# Patient Record
Sex: Female | Born: 1945 | Race: White | Hispanic: No | Smoking: Former smoker
Health system: Southern US, Community
[De-identification: ages and names within clinical notes are randomized; demographics above are authoritative.]

## PROBLEM LIST (undated history)

## (undated) DIAGNOSIS — R51 Headache: Secondary | ICD-10-CM

## (undated) DIAGNOSIS — E785 Hyperlipidemia, unspecified: Secondary | ICD-10-CM

## (undated) DIAGNOSIS — M1990 Unspecified osteoarthritis, unspecified site: Secondary | ICD-10-CM

## (undated) DIAGNOSIS — Z950 Presence of cardiac pacemaker: Secondary | ICD-10-CM

## (undated) DIAGNOSIS — F419 Anxiety disorder, unspecified: Secondary | ICD-10-CM

## (undated) DIAGNOSIS — Z87898 Personal history of other specified conditions: Secondary | ICD-10-CM

## (undated) DIAGNOSIS — R519 Headache, unspecified: Secondary | ICD-10-CM

## (undated) DIAGNOSIS — I442 Atrioventricular block, complete: Secondary | ICD-10-CM

## (undated) DIAGNOSIS — I2510 Atherosclerotic heart disease of native coronary artery without angina pectoris: Secondary | ICD-10-CM

## (undated) DIAGNOSIS — R112 Nausea with vomiting, unspecified: Secondary | ICD-10-CM

## (undated) DIAGNOSIS — I452 Bifascicular block: Secondary | ICD-10-CM

## (undated) DIAGNOSIS — I350 Nonrheumatic aortic (valve) stenosis: Secondary | ICD-10-CM

## (undated) DIAGNOSIS — Z9289 Personal history of other medical treatment: Secondary | ICD-10-CM

## (undated) DIAGNOSIS — Z9889 Other specified postprocedural states: Secondary | ICD-10-CM

## (undated) DIAGNOSIS — G43909 Migraine, unspecified, not intractable, without status migrainosus: Secondary | ICD-10-CM

## (undated) DIAGNOSIS — G8929 Other chronic pain: Secondary | ICD-10-CM

## (undated) DIAGNOSIS — I714 Abdominal aortic aneurysm, without rupture, unspecified: Secondary | ICD-10-CM

## (undated) DIAGNOSIS — I6529 Occlusion and stenosis of unspecified carotid artery: Secondary | ICD-10-CM

## (undated) DIAGNOSIS — K219 Gastro-esophageal reflux disease without esophagitis: Secondary | ICD-10-CM

## (undated) DIAGNOSIS — C801 Malignant (primary) neoplasm, unspecified: Secondary | ICD-10-CM

## (undated) DIAGNOSIS — R011 Cardiac murmur, unspecified: Secondary | ICD-10-CM

## (undated) HISTORY — PX: TONSILLECTOMY: SUR1361

## (undated) HISTORY — DX: Abdominal aortic aneurysm, without rupture: I71.4

## (undated) HISTORY — PX: ABDOMINAL HYSTERECTOMY: SHX81

## (undated) HISTORY — DX: Migraine, unspecified, not intractable, without status migrainosus: G43.909

## (undated) HISTORY — DX: Personal history of other medical treatment: Z92.89

## (undated) HISTORY — PX: APPENDECTOMY: SHX54

## (undated) HISTORY — DX: Atherosclerotic heart disease of native coronary artery without angina pectoris: I25.10

## (undated) HISTORY — DX: Occlusion and stenosis of unspecified carotid artery: I65.29

## (undated) HISTORY — DX: Atrioventricular block, complete: I44.2

## (undated) HISTORY — DX: Other chronic pain: G89.29

## (undated) HISTORY — DX: Hyperlipidemia, unspecified: E78.5

## (undated) HISTORY — DX: Abdominal aortic aneurysm, without rupture, unspecified: I71.40

---

## 1976-07-05 DIAGNOSIS — C801 Malignant (primary) neoplasm, unspecified: Secondary | ICD-10-CM

## 1976-07-05 HISTORY — DX: Malignant (primary) neoplasm, unspecified: C80.1

## 1999-07-29 ENCOUNTER — Encounter: Payer: Self-pay | Admitting: Family Medicine

## 1999-07-29 ENCOUNTER — Encounter: Admission: RE | Admit: 1999-07-29 | Discharge: 1999-07-29 | Payer: Self-pay | Admitting: Family Medicine

## 2000-09-16 ENCOUNTER — Encounter: Admission: RE | Admit: 2000-09-16 | Discharge: 2000-09-16 | Payer: Self-pay | Admitting: Family Medicine

## 2000-09-16 ENCOUNTER — Encounter: Payer: Self-pay | Admitting: Family Medicine

## 2001-03-10 ENCOUNTER — Encounter: Payer: Self-pay | Admitting: Family Medicine

## 2001-03-10 ENCOUNTER — Encounter: Admission: RE | Admit: 2001-03-10 | Discharge: 2001-03-10 | Payer: Self-pay | Admitting: Family Medicine

## 2001-11-07 ENCOUNTER — Encounter: Payer: Self-pay | Admitting: Family Medicine

## 2001-11-07 ENCOUNTER — Encounter: Admission: RE | Admit: 2001-11-07 | Discharge: 2001-11-07 | Payer: Self-pay | Admitting: Family Medicine

## 2003-01-11 ENCOUNTER — Encounter: Admission: RE | Admit: 2003-01-11 | Discharge: 2003-01-11 | Payer: Self-pay | Admitting: Family Medicine

## 2003-01-11 ENCOUNTER — Encounter: Payer: Self-pay | Admitting: Family Medicine

## 2003-05-13 ENCOUNTER — Encounter (INDEPENDENT_AMBULATORY_CARE_PROVIDER_SITE_OTHER): Payer: Self-pay | Admitting: Specialist

## 2003-05-13 ENCOUNTER — Ambulatory Visit (HOSPITAL_COMMUNITY): Admission: RE | Admit: 2003-05-13 | Discharge: 2003-05-13 | Payer: Self-pay | Admitting: Gastroenterology

## 2004-01-14 ENCOUNTER — Ambulatory Visit (HOSPITAL_COMMUNITY): Admission: RE | Admit: 2004-01-14 | Discharge: 2004-01-14 | Payer: Self-pay | Admitting: Family Medicine

## 2004-01-21 ENCOUNTER — Encounter: Admission: RE | Admit: 2004-01-21 | Discharge: 2004-01-21 | Payer: Self-pay | Admitting: Family Medicine

## 2005-02-04 ENCOUNTER — Encounter: Admission: RE | Admit: 2005-02-04 | Discharge: 2005-02-04 | Payer: Self-pay | Admitting: Family Medicine

## 2006-02-17 ENCOUNTER — Encounter: Admission: RE | Admit: 2006-02-17 | Discharge: 2006-02-17 | Payer: Self-pay | Admitting: Family Medicine

## 2006-07-05 HISTORY — PX: BREAST EXCISIONAL BIOPSY: SUR124

## 2007-03-15 ENCOUNTER — Encounter: Admission: RE | Admit: 2007-03-15 | Discharge: 2007-03-15 | Payer: Self-pay | Admitting: Family Medicine

## 2007-07-06 HISTORY — PX: BACK SURGERY: SHX140

## 2008-02-21 ENCOUNTER — Observation Stay (HOSPITAL_COMMUNITY): Admission: EM | Admit: 2008-02-21 | Discharge: 2008-02-22 | Payer: Self-pay | Admitting: Emergency Medicine

## 2008-02-22 HISTORY — PX: CARDIAC CATHETERIZATION: SHX172

## 2008-03-28 ENCOUNTER — Encounter: Admission: RE | Admit: 2008-03-28 | Discharge: 2008-03-28 | Payer: Self-pay | Admitting: Family Medicine

## 2009-04-10 ENCOUNTER — Encounter: Admission: RE | Admit: 2009-04-10 | Discharge: 2009-04-10 | Payer: Self-pay | Admitting: Family Medicine

## 2009-10-21 ENCOUNTER — Emergency Department (HOSPITAL_COMMUNITY): Admission: EM | Admit: 2009-10-21 | Discharge: 2009-10-21 | Payer: Self-pay | Admitting: Emergency Medicine

## 2009-11-03 HISTORY — PX: NM MYOCAR PERF WALL MOTION: HXRAD629

## 2010-01-21 ENCOUNTER — Ambulatory Visit (HOSPITAL_BASED_OUTPATIENT_CLINIC_OR_DEPARTMENT_OTHER): Admission: RE | Admit: 2010-01-21 | Discharge: 2010-01-21 | Payer: Self-pay | Admitting: Orthopedic Surgery

## 2010-04-14 ENCOUNTER — Encounter: Admission: RE | Admit: 2010-04-14 | Discharge: 2010-04-14 | Payer: Self-pay | Admitting: Family Medicine

## 2010-07-26 ENCOUNTER — Encounter: Payer: Self-pay | Admitting: Family Medicine

## 2010-09-19 LAB — POCT I-STAT 4, (NA,K, GLUC, HGB,HCT)
Glucose, Bld: 93 mg/dL (ref 70–99)
Potassium: 4.3 mEq/L (ref 3.5–5.1)

## 2010-09-22 LAB — POCT CARDIAC MARKERS: Troponin i, poc: <0.05 ng/mL (ref 0.00–0.09)

## 2010-09-22 LAB — CBC
Hemoglobin: 14.4 g/dL (ref 12.0–15.0)
MCHC: 35.5 g/dL (ref 30.0–36.0)
MCV: 91.9 fL (ref 78.0–100.0)
RDW: 14.2 % (ref 11.5–15.5)

## 2010-09-22 LAB — BASIC METABOLIC PANEL
BUN: 13 mg/dL (ref 6–23)
GFR calc Af Amer: >60 mL/min (ref >60)
Glucose, Bld: 94 mg/dL (ref 70–99)
Potassium: 3.9 mEq/L (ref 3.5–5.1)
Sodium: 139 mEq/L (ref 135–145)

## 2010-09-22 LAB — CK TOTAL AND CKMB (NOT AT ARMC)
CK, MB: 1.3 ng/mL (ref 0.3–4.0)
Relative Index: INVALID (ref 0.0–2.5)
Total CK: 65 U/L (ref 7–177)

## 2010-09-22 LAB — DIFFERENTIAL
Basophils Absolute: 0.1 10*3/uL (ref 0.0–0.1)
Eosinophils Relative: 2 % (ref 0–5)
Lymphs Abs: 2.7 10*3/uL (ref 0.7–4.0)
Monocytes Absolute: 0.7 10*3/uL (ref 0.1–1.0)
Monocytes Relative: 9 % (ref 3–12)
Neutrophils Relative %: 53 % (ref 43–77)

## 2010-09-22 LAB — TROPONIN I: Troponin I: 0.01 ng/mL (ref 0.00–0.06)

## 2010-11-12 HISTORY — PX: TRANSTHORACIC ECHOCARDIOGRAM: SHX275

## 2010-11-17 NOTE — Discharge Summary (Signed)
[name], [name] NO.:  [id number]   MEDICAL RECORD NO.:  [id number]          PATIENT TYPE:  INP   LOCATION:  3735                         FACILITY:  [facility]   PHYSICIAN:  Nicki Guadalajara, M.D.     DATE OF BIRTH:  07-Nov-1945   DATE OF ADMISSION:  02/21/2008  DATE OF DISCHARGE:  02/22/2008                               DISCHARGE SUMMARY   DISCHARGE DIAGNOSES:  1. Chest pain worrisome for unstable angina, catheterization this      admission revealing mild-to-moderate coronary disease.  2. History of smoking.  3. History of dyslipidemia.  4. Family history of coronary artery disease.  5. Chronic back pain, status post surgery twice in the past.   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  She had  surgery in February 2009.  She is out until April 2010.  She has risk  factors for coronary artery disease.  She presented to emergency room on  February 21, 2008, with chest pain worrisome for unstable angina.  Please  see admission history and physical for complete details.  She has  multiple risk factors for coronary disease.  She was started on IV  heparin and nitrates.  Enzymes were negative.  She was set up for  diagnostic catheterization, which was done today on February 22, 2008.  Catheterization by Dr. 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  EF was 60%.  There was mild renal  artery narrowing.  Plan is for continued medical therapy.   DISCHARGE MEDICATIONS:  The patient has recently been put on simvastatin  80 mg a day by her primary care doctor and she will continue this.  She  will also start baby aspirin a day.  She can continue taking her anti-  inflammatory medicine for her back, although we will add omeprazole 20  mg a day with this.  She is also on metoprolol 25 mg twice a day.  She  can continue taking her vitamins  including vitamin E, fish oil, and  Premarin 1.25 mg a day.   LABS:  White count 9.1, hemoglobin 13.8, hematocrit 40.5, and platelets  176.  INR 1.0.  Sodium 140, potassium 3.8, BUN 12, creatinine 0.64.  Lipids done show a VLDL of 41, LDL 90, triglycerides 207 on a nonfasting  sample, HDL 50.  CK-MB and troponins were negative.  TSH normal 0.59.   Chest x-ray, no active disease.   DISPOSITION:  The patient is discharged in stable condition and will  follow up with Dr. Tresa Endo in a couple of weeks.      Abelino Derrick, P.A.    ______________________________  Nicki Guadalajara, M.D.    Lenard Lance  D:  02/22/2008  T:  02/23/2008  Job:  [id number]   cc:   Nicki Guadalajara, M.D.

## 2010-11-17 NOTE — Cardiovascular Report (Signed)
[name], [name]              ACCOUNT NO.:  [id number]   MEDICAL RECORD NO.:  [id number]          PATIENT TYPE:  INP   LOCATION:  3735                         FACILITY:  [facility]   PHYSICIAN:  Richard A. Alanda Amass, M.D.DATE OF BIRTH:  [birth year]   DATE OF PROCEDURE:  02/22/2008  DATE OF DISCHARGE:  02/22/2008                            CARDIAC CATHETERIZATION   PROCEDURES:  Retrograde central aortic catheterization, selective  coronary angiography via Judkins technique, pre- and post-intracoronary  nitroglycerin administration, left ventricular angiogram in right  anterior oblique and left anterior oblique projection, abdominal aortic  angiogram midstream posteroanterior projection.   PROCEDURE IN DETAIL:  The patient was brought to the second floor of CP  Lab in a postabsorptive state.  Informed consent was obtained to proceed  with diagnostic procedure.  Preoperative laboratory showed normal renal  function and coags.  The patient was premedicated with 5 mg Valium p.o.  who was in a postabsorptive state, brought to the second floor of CP  Lab.  Right groin was prepped and draped in usual manner.  Xylocaine 1%  was used for local anesthesia.  The CRFA was entered with single  anterior puncture using 18 thin wall needle and a 6-French short sidearm  sheath were inserted without difficulty.  The patient was given 2 mg of  Versed for sedation IV.  Select guidewire exchange was used throughout  the procedure and selective coronary angiography was done with 6-French  4-cm taper Cordis preformed coronary and pigtail catheters.  The IC  nitroglycerin 200 mg was given at the right coronary artery with repeat  injections obtained in the RAO and LAO projection.  LV angiogram was  done at the RAO and LAO projection 25 mL and 14 mL per second, and  20  mL and 12 mL per second respectively.  Pullback pressure to CA was  performed showed no gradient across the aortic valve.  Catheter was  brought down above the level of the renal arteries and abdominal aortic  angiogram was done in the midstream PA projection demonstrating single  patent normal renal arteries, patent SMA and celiac axis, patent IMA and  no significant proximal iliac disease.  There was mild infrarenal  atherosclerotic disease and normal single renal arteries bilaterally.  There was no evidence of FMD.   Catheters were removed.  Sidearm sheath was flushed and the hand  injection at the right SFA and common femoral showed good puncture into  the RCFA.  Using standard technique and a 6-French StarClose nitinol  clip was successfully deployed for vessel closure.  The patient was  transferred to the holding area for postoperative care in stable  condition.  She tolerated the procedure well.   PRESSURES:  LV:  125/0; LVEDP 16 mmHg.   CA:  25/60 mmHg.  There is no gradient between LV and CA catheter  pullback.   LV angiogram demonstrated normally contracting ventricle in both  projections with no segmental wall motion abnormality and no mitral  regurgitation.  Systolic function was vigorous and EF was 60% or  slightly greater.   Fluoroscopy showed very faint calcification in  the proximal LAD reveals  no other coronary intracardiac or valvular calcification visualized.   The main left coronary artery was short, but widely patent and normal.   The LAD coursed at the apex of the heart where it bifurcated.  It was  relatively thin from the midportion to the apex, but fairly good  residual diameter and mild irregularities and no significant areas of  stenosis.  There is a small area in the mid LAD by myocardial bridging,  but no systolic compression or stenosis.  Flow throughout the LAD and  diagonals was normal.  The large first diagonal occurred before the  first septal perforator trifurcated and was essentially normal.   There was a small normal OD branch.  There was a small normal OM1  branch.  There was  mild 20% irregularity at the proximal circumflex just  before the PAVG branch, which was normal.  The remainder of the AV  groove circumflex was widely patent, smooth with no narrowing.  There  was a moderate size OM2, was smaller and free, and the distal circumflex  was normal.   The right coronary was a dominant vessel.  There was mildly segmental  50% or slightly less concentric narrowing of the mid RCA with a  minimally irregular appearance.  There was no thrombus or disruption  present and there was excellent residual lumen.  There was no  significant change with nitroglycerin.  Beyond this, was a normal RV  branch.  There was a normal PDA.  Second normal RV branch beyond the  acute margin and the normal PDA and bifurcating PLA, which were both  widely patent and large.   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  She has a history of smoking, recent diagnosis of hyperlipidemia.  She  has had back surgery with back fusion last year at [facility] and is doing  better in this regard.  She was admitted on February 21, 2008, with  recurrent chest pain that was nitrate responsive and compatible with  ischemia.  She was seen by Dr. Tresa Endo and set up for diagnostic  catheterization and was started on medical therapy.  Catheterization  reveals atherosclerotic approximately 50% narrowing mildly segmental of  the proximal - mid dominant right coronary artery.  No significant left  coronary artery disease, normal LV function, and no renal artery  stenosis.  She does have history of hyperlipidemia and normal blood  pressure and no history of diabetes.  We will recommend a vigorous trial  of medical therapy, smoking cessation, and addition of antiplatelet  therapy.  Probably I doubt, she might benefit from empiric PPI therapy  as well at this time.  We would recommend follow up with her primary  care  physician as well as Dr. Tresa Endo for her mild coronary artery  disease.   CATHETERIZATION DIAGNOSES:  1. Chest pain, nitrate responsive compatible with ischemia.  2. Mild single vessel coronary artery disease, 50% proximal - mid      right coronary artery.  3. Chronic smoker.  4. Hyperlipidemia.  5. Normal left ventricular function.  6. Lumbosacral back fusion with rods and apparatus visible on      fluoroscopy.      Richard A. Alanda Amass, M.D.  Electronically Signed     RAW/MEDQ  D:  02/22/2008  T:  02/23/2008  Job:  [id number]   cc:   [facility]  Ace Gins, MD  Nicki Guadalajara, M.D.

## 2010-11-20 NOTE — Op Note (Signed)
   NAME:  [name], [name]                        ACCOUNT NO.:  [id number]   MEDICAL RECORD NO.:  [id number]                   PATIENT TYPE:  AMB   LOCATION:  ENDO                                 FACILITY:  [facility]   PHYSICIAN:  Anselmo Rod, M.D.               DATE OF BIRTH:  [birth year]   DATE OF PROCEDURE:  05/13/2003  DATE OF DISCHARGE:                                 OPERATIVE REPORT   PROCEDURE:  Colonoscopy with biopsies x3.   ENDOSCOPIST:  Anselmo Rod, M.D.   INSTRUMENT USED:  Olympus video colonoscope.   INDICATIONS FOR PROCEDURE:  Screening colonoscopy is performed in a 65-year-  old female to rule out colonic polyps, masses, etc.   PREPROCEDURE PREPARATION:  Informed consent was procured from the patient.  The patient was fasted for eight hours prior to the procedure and prepped  with a bottle of magnesium citrate and a gallon of GoLYTELY the night prior  to the procedure.   PREPROCEDURE PHYSICAL EXAMINATION:  VITAL SIGNS:  Stable.  NECK:  Supple.  CHEST:  Clear to auscultation.  S1 and S2 regular.  ABDOMEN:  Soft with normal bowel sounds.   DESCRIPTION OF PROCEDURE:  The patient was placed in the left lateral  decubitus position and sedated with 60 mg of Demerol and 6 mg of Versed  intravenously.  She was also given 400 mg of Cipro intravenously for SBE  prophylaxis.  Once the patient was adequately sedated and maintained on low  flow oxygen and continuous cardiac monitoring, the Olympus video colonoscope  was advanced from the rectum to the cecum without difficulty.  The  appendiceal orifice and ileocecal valve were clearly visualized and  photographed.  There were two small ulcerations noted in the terminal ileum  probably secondary to nonsteroidal use.  Biopsies were done.  No other  abnormalities were noted.  The patient tolerated the procedure well without  complications.   IMPRESSION:  1. Normal colonoscopy up to the cecum.  2. Two small ulcers seen in  the terminal ileum.  Biopsies were done.    RECOMMENDATIONS:  Avoid nonsteroidals.  Await pathology results.  Outpatient  follow-up in the next two weeks or earlier if needed.  Repeat CRC screening  in the next 10 years unless the patient develops any abnormal symptoms in  the interim.                                               Anselmo Rod, M.D.    JNM/MEDQ  D:  05/13/2003  T:  05/14/2003  Job:  [id number]   cc:   Tammy R. Collins Scotland, M.D.  [address]  [place]  [place] [address]  Fax: [phone]

## 2011-02-24 ENCOUNTER — Other Ambulatory Visit: Payer: Self-pay | Admitting: Dermatology

## 2011-03-09 ENCOUNTER — Other Ambulatory Visit: Payer: Self-pay | Admitting: Family Medicine

## 2011-03-09 DIAGNOSIS — Z1231 Encounter for screening mammogram for malignant neoplasm of breast: Secondary | ICD-10-CM

## 2011-04-16 ENCOUNTER — Ambulatory Visit
Admission: RE | Admit: 2011-04-16 | Discharge: 2011-04-16 | Disposition: A | Discharge status: Home / Self Care | Payer: Medicare Other | Source: Ambulatory Visit | Attending: Family Medicine | Admitting: Family Medicine

## 2011-04-16 DIAGNOSIS — Z1231 Encounter for screening mammogram for malignant neoplasm of breast: Secondary | ICD-10-CM

## 2011-04-21 ENCOUNTER — Other Ambulatory Visit: Payer: Self-pay | Admitting: Family Medicine

## 2011-04-21 DIAGNOSIS — R928 Other abnormal and inconclusive findings on diagnostic imaging of breast: Secondary | ICD-10-CM

## 2011-05-06 ENCOUNTER — Ambulatory Visit
Admission: RE | Admit: 2011-05-06 | Discharge: 2011-05-06 | Disposition: A | Discharge status: Home / Self Care | Payer: Medicare Other | Source: Ambulatory Visit | Attending: Family Medicine | Admitting: Family Medicine

## 2011-05-06 DIAGNOSIS — R928 Other abnormal and inconclusive findings on diagnostic imaging of breast: Secondary | ICD-10-CM

## 2011-12-08 ENCOUNTER — Other Ambulatory Visit: Payer: Self-pay | Admitting: Family Medicine

## 2011-12-08 DIAGNOSIS — N649 Disorder of breast, unspecified: Secondary | ICD-10-CM

## 2011-12-15 ENCOUNTER — Other Ambulatory Visit: Payer: Self-pay | Admitting: Family Medicine

## 2011-12-15 ENCOUNTER — Ambulatory Visit
Admission: RE | Admit: 2011-12-15 | Discharge: 2011-12-15 | Disposition: A | Discharge status: Home / Self Care | Payer: Medicare Other | Source: Ambulatory Visit | Attending: Family Medicine | Admitting: Family Medicine

## 2011-12-15 DIAGNOSIS — N649 Disorder of breast, unspecified: Secondary | ICD-10-CM

## 2012-03-07 ENCOUNTER — Other Ambulatory Visit: Payer: Self-pay | Admitting: Family Medicine

## 2012-03-07 DIAGNOSIS — Z1231 Encounter for screening mammogram for malignant neoplasm of breast: Secondary | ICD-10-CM

## 2012-04-17 ENCOUNTER — Ambulatory Visit
Admission: RE | Admit: 2012-04-17 | Discharge: 2012-04-17 | Disposition: A | Discharge status: Home / Self Care | Payer: Medicare Other | Source: Ambulatory Visit | Attending: Family Medicine | Admitting: Family Medicine

## 2012-04-17 DIAGNOSIS — Z1231 Encounter for screening mammogram for malignant neoplasm of breast: Secondary | ICD-10-CM

## 2012-12-28 ENCOUNTER — Other Ambulatory Visit: Payer: Self-pay

## 2012-12-28 MED ORDER — NEBIVOLOL HCL 5 MG PO TABS: 5.0000 mg | ORAL_TABLET | Freq: Every day | ORAL | Status: DC

## 2012-12-28 NOTE — Telephone Encounter (Signed)
Rx was sent to pharmacy electronically. 

## 2013-02-08 ENCOUNTER — Other Ambulatory Visit: Payer: Self-pay | Admitting: *Deleted

## 2013-02-08 MED ORDER — NEBIVOLOL HCL 5 MG PO TABS: 5.0000 mg | ORAL_TABLET | Freq: Every day | ORAL | Status: DC

## 2013-02-08 NOTE — Telephone Encounter (Signed)
Rx was sent to pharmacy electronically. 

## 2013-02-12 ENCOUNTER — Other Ambulatory Visit: Payer: Self-pay | Admitting: *Deleted

## 2013-03-13 ENCOUNTER — Other Ambulatory Visit: Payer: Self-pay

## 2013-03-13 DIAGNOSIS — Z1231 Encounter for screening mammogram for malignant neoplasm of breast: Secondary | ICD-10-CM

## 2013-04-19 ENCOUNTER — Ambulatory Visit
Admission: RE | Admit: 2013-04-19 | Discharge: 2013-04-19 | Disposition: A | Discharge status: Home / Self Care | Payer: Medicare Other | Source: Ambulatory Visit

## 2013-04-19 DIAGNOSIS — Z1231 Encounter for screening mammogram for malignant neoplasm of breast: Secondary | ICD-10-CM

## 2013-05-12 ENCOUNTER — Other Ambulatory Visit: Payer: Self-pay | Admitting: Cardiovascular Disease

## 2013-05-14 NOTE — Telephone Encounter (Signed)
Rx was sent to pharmacy electronically. 

## 2013-06-19 ENCOUNTER — Other Ambulatory Visit: Payer: Self-pay | Admitting: Cardiovascular Disease

## 2013-06-19 NOTE — Telephone Encounter (Signed)
Rx was sent to pharmacy electronically. 

## 2013-07-04 ENCOUNTER — Other Ambulatory Visit: Payer: Self-pay | Admitting: Cardiovascular Disease

## 2013-07-28 ENCOUNTER — Other Ambulatory Visit: Payer: Self-pay | Admitting: Cardiovascular Disease

## 2013-07-30 ENCOUNTER — Telehealth: Payer: Self-pay | Admitting: *Deleted

## 2013-07-30 ENCOUNTER — Other Ambulatory Visit: Payer: Self-pay | Admitting: *Deleted

## 2013-07-30 MED ORDER — NEBIVOLOL HCL 5 MG PO TABS
5.0000 mg | ORAL_TABLET | Freq: Every day | ORAL | Status: DC
Start: 2013-07-30 — End: 2014-01-11

## 2013-07-30 NOTE — Telephone Encounter (Signed)
Correction made on Bystolic refill.  She is in recall to see Dr. Claiborne Billings in May 2015.  Refills given until then.

## 2013-07-30 NOTE — Telephone Encounter (Signed)
Pt is stating that the pharmacy is stating that we need to call them because they will not fill her medication. She was told that she needed to have an appointment. She needs a refill on Bystolic 5mg .   TK

## 2013-07-30 NOTE — Telephone Encounter (Signed)
Returned call.  Pt stated another lady called a few minutes ago.

## 2013-11-12 ENCOUNTER — Other Ambulatory Visit: Payer: Self-pay

## 2013-11-16 ENCOUNTER — Encounter: Payer: Self-pay | Admitting: *Deleted

## 2013-11-19 ENCOUNTER — Ambulatory Visit (INDEPENDENT_AMBULATORY_CARE_PROVIDER_SITE_OTHER): Payer: Medicare Other | Admitting: Cardiovascular Disease

## 2013-11-19 ENCOUNTER — Encounter: Payer: Self-pay | Admitting: Cardiovascular Disease

## 2013-11-19 VITALS — BP 142/72 | HR 61 | Ht 65.0 in | Wt 167.0 lb

## 2013-11-19 DIAGNOSIS — I2510 Atherosclerotic heart disease of native coronary artery without angina pectoris: Secondary | ICD-10-CM | POA: Insufficient documentation

## 2013-11-19 DIAGNOSIS — R5383 Other fatigue: Secondary | ICD-10-CM

## 2013-11-19 DIAGNOSIS — E782 Mixed hyperlipidemia: Secondary | ICD-10-CM

## 2013-11-19 DIAGNOSIS — R002 Palpitations: Secondary | ICD-10-CM

## 2013-11-19 DIAGNOSIS — R5381 Other malaise: Secondary | ICD-10-CM

## 2013-11-19 DIAGNOSIS — R0989 Other specified symptoms and signs involving the circulatory and respiratory systems: Secondary | ICD-10-CM

## 2013-11-19 DIAGNOSIS — I4510 Unspecified right bundle-branch block: Secondary | ICD-10-CM

## 2013-11-19 DIAGNOSIS — I440 Atrioventricular block, first degree: Secondary | ICD-10-CM | POA: Insufficient documentation

## 2013-11-19 DIAGNOSIS — I350 Nonrheumatic aortic (valve) stenosis: Secondary | ICD-10-CM

## 2013-11-19 DIAGNOSIS — I359 Nonrheumatic aortic valve disorder, unspecified: Secondary | ICD-10-CM

## 2013-11-19 NOTE — Progress Notes (Signed)
Patient ID: [name], female   DOB: [birth year], 68 y.o.   MRN: [id number]     HPI: FREDDA Rhodes is a 68 y.o. female who presents to the office today for a 21 month follow up cardiology evaluation.  Ms [name] has documented mild coronary obstructive disease by cardiac catheterization in 2009.  When she was found to have 50% RCA stenosis and 20% stenosis in the proximal left circumflex coronary artery.  In the past, she has a history of atrial arrhythmia, treated initially with metoprolol, but due to significant fatigability.  This was switched to Bystolic, for which he has tolerated well.  In 2011 a nuclear perfusion study showed normal perfusion.  An echo Doppler study in May 2012, showed normal systolic function with suggestion of grade 1 diastolic dysfunction.  There is mild mitral annular calcification trace MR, trace, TR, and she had moderate calcification of her aortic valve leaflets with mild valvular aortic stenosis and mild aortic regurgitation.  Mean gradient was 9.5 mm and peak gradient was 20 mm across her aortic valve.  The last 2 years, she has remained fairly stable on her current medical regimen.  She sees Dr. Management consultant for primary care.  She had been started on low-dose simvastatin in the past, but apparently was only on the short-term and stopped taking it.  She underwent eye surgery to repair her droopy eyelids.  Last week.  The operative CBC and chemistry profile were normal.  She tolerated her surgery well.    She now presents for cardiology evaluation.  She denies any episodes of chest pain.  She is unaware of palpitations.  She denies any shortness of breath.  She's not had her lipids checked.  Her thyroid function checked in sometime  Past Medical History  Diagnosis Date  . Hyperlipidemia     Past Surgical History  Procedure Laterality Date  . Transthoracic echocardiogram  11/12/2010    EF=>55%, moderate calcification of aortic valve leaflets, no MVP  seen, normal LV thickness   . Nm myocar perf wall motion  11/03/2009    protocol: Persantine, no evidence of ischemia/infarct, post EF67%  . Cardiac catheterization  02/22/2008    no significant CAD, 50% narrowing of proximal mid-dom. right Coronary artery. medical therapy along with antiplatlet therapy     Allergies not on file  Current Outpatient Prescriptions  Medication Sig Dispense Refill  . aspirin 81 MG tablet Take 81 mg by mouth daily.      . clonazePAM (KLONOPIN) 0.5 MG tablet Take 0.5 mg by mouth daily.      Marland Kitchen estrogens, conjugated, (PREMARIN) 1.25 MG tablet Take 1.25 mg by mouth daily.      . Flaxseed, Linseed, (FLAXSEED OIL PO) Take 1 capsule by mouth daily.      Marland Kitchen gabapentin (NEURONTIN) 300 MG capsule Take 300 mg by mouth as needed.      . meloxicam (MOBIC) 15 MG tablet Take 15 mg by mouth daily.      . nebivolol (BYSTOLIC) 5 MG tablet Take 1 tablet (5 mg total) by mouth daily.  30 tablet  4  . vitamin E 1000 UNIT capsule Take 1,000 Units by mouth daily.       No current facility-administered medications for this visit.    History   Social History  . Marital Status: Married    Spouse Name: [name]    Number of Children: [name]  . Years of Education: [name]   Occupational History  . Not  on file.   Social History Main Topics  . Smoking status: Former Smoker -- 1.00 packs/day for 30 years    Types: Cigarettes  . Smokeless tobacco: Never Used  . Alcohol Use: Yes     Comment: 1 glass per week  . Drug Use: Not on file  . Sexual Activity: Not on file   Other Topics Concern  . Not on file   Social History Narrative  . No narrative on file    Family History  Problem Relation Age of Onset  . Cancer Mother   . Hypertension Sister   . Diabetes Brother   . Hyperlipidemia Brother   . Heart disease Maternal Grandmother   . Diabetes Maternal Grandmother   . Diabetes Maternal Grandfather   . Heart disease Maternal Grandfather   . Tuberculosis Paternal Grandmother   .  Stroke Paternal Grandfather   . Diabetes Brother   . Hyperlipidemia Brother   . Heart disease Brother   . Hyperlipidemia Son     ROS General: Negative; No fevers, chills, or night sweats HEENT: Positive for droopy eyelids, status post surgery done last week; No changes in vision or hearing, sinus congestion, difficulty swallowing Pulmonary: Negative; No cough, wheezing, shortness of breath, hemoptysis Cardiovascular: Negative; No chest pain, presyncope, syncope, palpatations GI: Negative; No nausea, vomiting, diarrhea, or abdominal pain GU: Negative; No dysuria, hematuria, or difficulty voiding Musculoskeletal: Negative; no myalgias, joint pain, or weakness Hematologic: Negative; no easy bruising, bleeding Endocrine: Negative; no heat/cold intolerance; no diabetes, Neuro: Negative; no changes in balance, headaches Skin: Negative; No rashes or skin lesions Psychiatric: Negative; No behavioral problems, depression Sleep: Negative; No snoring,  daytime sleepiness, hypersomnolence, bruxism, restless legs, hypnogognic hallucinations. Other comprehensive 14 point system review is negative    Physical Exam BP 142/72  Pulse 61  Ht 5' 5"  (1.651 m)  Wt 167 lb (75.751 kg)  BMI 27.79 kg/m2 General: Alert, oriented, no distress.  Skin: normal turgor, no rashes, warm and dry HEENT: Normocephalic, atraumatic. Pupils equal round and reactive to light; sclera anicteric; extraocular muscles intact, No lid lag; Nose without nasal septal hypertrophy; Mouth/Parynx benign; Mallinpatti scale 2 Neck:  Right carotid bruit versus transmitted murmur;No JVD; normal carotid upstroke Lungs: clear to ausculatation and percussion bilaterally; no wheezing or rales, normal inspiratory and expiratory effort Chest wall: without tenderness to palpitation Heart: PMI not displaced, RRR, s1 s2 normal, 1-6/9 systolic murmur early peaking in the aortic region and 6-7/8 systolic murmur at the apex, No diastolic murmur, no  rubs, gallops, thrills, or heaves Abdomen: soft, nontender; no hepatosplenomehaly, BS+; abdominal aorta nontender and not dilated by palpation. Back: no CVA tenderness Pulses: 2+  Musculoskeletal: full range of motion, normal strength, no joint deformities, she is status post lumbar surgery Extremities: Pulses 2+, no clubbing cyanosis or edema, Homan's sign negative  Neurologic: grossly nonfocal; Cranial nerves grossly wnl Psychologic: Normal mood and affect    ECG (independently read by me): Normal sinus rhythm at 61 beats per minute.  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 This calculation has not been validated in all clinical situations. eGFR's persistently <60 mL/min signify possible Chronic Kidney Disease. 10/21/2009 1044     Hepatic Function Panel  No results found for this basename: prot, albumin, ast, alt, alkphos, bilitot, bilidir, ibili     CBC    Component Value Date/Time   WBC 8.0 10/21/2009 1044   RBC 4.43 10/21/2009 1044   HGB 15.3* 01/21/2010 0852   HCT 45.0 01/21/2010 0852   PLT 171 10/21/2009 1044   MCV 91.9 10/21/2009 1044   MCHC 35.5 10/21/2009 1044   RDW 14.2 10/21/2009 1044   LYMPHSABS 2.7 10/21/2009 1044   MONOABS 0.7 10/21/2009 1044   EOSABS 0.2 10/21/2009 1044   BASOSABS 0.1 10/21/2009 1044     BNP No results found for this basename: probnp    Lipid Panel  No results found for this basename: chol, trig, hdl, cholhdl, vldl, ldlcalc     RADIOLOGY: No results found.    ASSESSMENT AND PLAN:  Md Gascoigne is a  68 year old female who underwent cardiac catheterization in 2009.  This was done after she experienced nitrate responsive chest pain.  A subsequent nuclear study 2 years later continued to show normal perfusion.  She denies any anginal type symptoms.  Presently.  She does have cardiac murmur suggestive of aortic stenosis in the carotid findings may be due to transmitted murmur versus plaque/bruit.  I am recommending she undergo a 3 year followup echo Doppler study will also schedule her for carotid duplex imaging.  I am checking a lipid panel, as well as a TSH level.  I did review her recent preoperative blood work done on 10/31/2013.  Her ECG today shows right bundle branch block, which is new, as well as first degree AV block.  She is on very low-dose diastolic of 5 mg.  Her blood pressure today is stable.  She is not bradycardic.  She remains symptom-free with reference to this bundle branch block.  I'll see her back in the office in 2-3 months for followup evaluation in followup of the above studies.    Troy Sine, MD, [facility]  11/19/2013 3:06 PM

## 2013-11-19 NOTE — Patient Instructions (Signed)
Your physician has requested that you have a carotid duplex. This test is an ultrasound of the carotid arteries in your neck. It looks at blood flow through these arteries that supply the brain with blood. Allow one hour for this exam. There are no restrictions or special instructions.  Your physician has requested that you have an echocardiogram. Echocardiography is a painless test that uses sound waves to create images of your heart. It provides your doctor with information about the size and shape of your heart and how well your heart's chambers and valves are working. This procedure takes approximately one hour. There are no restrictions for this procedure.  Your physician recommends that you return for lab work fasting.  Your physician recommends that you schedule a follow-up appointment in: 3 months

## 2013-11-20 ENCOUNTER — Encounter: Payer: Self-pay | Admitting: Cardiovascular Disease

## 2014-01-11 ENCOUNTER — Other Ambulatory Visit: Payer: Self-pay | Admitting: *Deleted

## 2014-01-11 MED ORDER — NEBIVOLOL HCL 5 MG PO TABS: 5.0000 mg | ORAL_TABLET | Freq: Every day | ORAL | Status: DC

## 2014-01-11 NOTE — Telephone Encounter (Signed)
Rx refill sent to patient pharmacy   

## 2014-01-14 ENCOUNTER — Ambulatory Visit (HOSPITAL_BASED_OUTPATIENT_CLINIC_OR_DEPARTMENT_OTHER)
Admission: RE | Admit: 2014-01-14 | Discharge: 2014-01-14 | Disposition: A | Discharge status: Home / Self Care | Payer: Medicare Other | Source: Ambulatory Visit | Attending: Cardiovascular Disease | Admitting: Cardiovascular Disease

## 2014-01-14 ENCOUNTER — Ambulatory Visit (HOSPITAL_COMMUNITY)
Admission: RE | Admit: 2014-01-14 | Discharge: 2014-01-14 | Disposition: A | Discharge status: Home / Self Care | Payer: Medicare Other | Source: Ambulatory Visit | Attending: Cardiovascular Disease | Admitting: Cardiovascular Disease

## 2014-01-14 DIAGNOSIS — R0989 Other specified symptoms and signs involving the circulatory and respiratory systems: Secondary | ICD-10-CM

## 2014-01-14 DIAGNOSIS — I359 Nonrheumatic aortic valve disorder, unspecified: Secondary | ICD-10-CM

## 2014-01-14 DIAGNOSIS — I6529 Occlusion and stenosis of unspecified carotid artery: Secondary | ICD-10-CM

## 2014-01-14 DIAGNOSIS — I350 Nonrheumatic aortic (valve) stenosis: Secondary | ICD-10-CM

## 2014-01-14 DIAGNOSIS — I2510 Atherosclerotic heart disease of native coronary artery without angina pectoris: Secondary | ICD-10-CM

## 2014-01-14 NOTE — Progress Notes (Signed)
2D Echo Performed 01/14/2014    Marygrace Drought, RCS

## 2014-01-14 NOTE — Progress Notes (Signed)
Carotid Duplex Completed. Lanecia Sliva, BS, RDMS, RVT  

## 2014-01-15 LAB — LIPID PANEL
CHOLESTEROL: 183 mg/dL (ref 0–200)
HDL: 65 mg/dL (ref >39)
LDL Cholesterol: 94 mg/dL (ref 0–99)
TRIGLYCERIDES: 118 mg/dL (ref <150)
Total CHOL/HDL Ratio: 2.8 Ratio
VLDL: 24 mg/dL (ref 0–40)

## 2014-01-15 LAB — TSH: TSH: 0.641 u[IU]/mL (ref 0.350–4.500)

## 2014-01-17 ENCOUNTER — Telehealth: Payer: Self-pay | Admitting: Cardiovascular Disease

## 2014-01-17 NOTE — Telephone Encounter (Signed)
Returned call to patient Dr.Kelly has not reviewed lab work and Echo.Dr.Kelly and his nurse out of office.Message sent to [facility] to call patient with results after Dr.Kelly reviews.

## 2014-01-17 NOTE — Telephone Encounter (Signed)
Pt woulf like her echo and doppler results from 01-14-14 if they are ready. She also wants her lab results from 01-14-14.

## 2014-01-24 ENCOUNTER — Encounter: Payer: Self-pay | Admitting: *Deleted

## 2014-02-21 ENCOUNTER — Encounter: Payer: Self-pay | Admitting: *Deleted

## 2014-02-27 ENCOUNTER — Ambulatory Visit (INDEPENDENT_AMBULATORY_CARE_PROVIDER_SITE_OTHER): Payer: Medicare Other | Admitting: Cardiovascular Disease

## 2014-02-27 ENCOUNTER — Encounter: Payer: Self-pay | Admitting: Cardiovascular Disease

## 2014-02-27 VITALS — BP 126/65 | HR 59 | Ht 65.0 in | Wt 165.5 lb

## 2014-02-27 DIAGNOSIS — I359 Nonrheumatic aortic valve disorder, unspecified: Secondary | ICD-10-CM

## 2014-02-27 DIAGNOSIS — I440 Atrioventricular block, first degree: Secondary | ICD-10-CM

## 2014-02-27 DIAGNOSIS — I2510 Atherosclerotic heart disease of native coronary artery without angina pectoris: Secondary | ICD-10-CM

## 2014-02-27 DIAGNOSIS — I350 Nonrheumatic aortic (valve) stenosis: Secondary | ICD-10-CM | POA: Insufficient documentation

## 2014-02-27 DIAGNOSIS — I4510 Unspecified right bundle-branch block: Secondary | ICD-10-CM

## 2014-02-27 NOTE — Progress Notes (Signed)
Patient ID: [name], female   DOB: 02-19-1946, 68 y.o.   MRN: [id number]     HPI: MAZELLA DEEN is a 68 y.o. female who presents to the office today for a 3 month follow up cardiology evaluation.  Ms CAELYN ROUTE has documented mild coronary obstructive disease by cardiac catheterization in 2009 with a  50% RCA stenosis and 20% stenosis in the proximal left circumflex coronary artery.  In the past, she has a history of atrial arrhythmia, treated initially with metoprolol, but due to significant fatigability this was switched to Bystolic, for which he has tolerated well.  In 2011 a nuclear perfusion study showed normal perfusion.  An echo Doppler study in May 2012, showed normal systolic function with suggestion of grade 1 diastolic dysfunction.  There is mild mitral annular calcification trace MR, trace, TR, and she had moderate calcification of her aortic valve leaflets with mild valvular aortic stenosis and mild aortic regurgitation.  Mean gradient was 9.5 mm and peak gradient was 20 mm across her aortic valve.  The last 2 years, she has remained fairly stable on her current medical regimen.  She sees Dr. Management consultant for primary care.  She had been started on low-dose simvastatin in the past, but apparently was only on the short-term and stopped taking it.  She underwent eye surgery to repair her droopy eyelids.  Last week.  The operative CBC and chemistry profile were normal.  She tolerated her surgery well.    I saw her 3 months ago for a 21 month .  Follow up evaluation.  At that time, I recommended that she undergo a three-year followup echo Doppler study. If her cardiac murmur suggestive of aortic stenosis.  There also was carotid findings which may be due to transmitted murmur versus plaque.  She underwent carotid duplex scan revealed mild bilateral carotid plaque without significant stenosis.  An echo Doppler study was done on 01/14/2014.  This showed normal systolic function with mild  grade 1 diastolic dysfunction.  There was evidence for mild aortic valve stenosis with trivial aortic insufficiency with a mean transvalvular aortic gradient of 10 mm and peak gradient of 20 mm.  The left atrium was mildly dilated.  She had mild mitral annular calcification with mild MR.  I also reviewed.  Laboratory, which was done, one month ago, which revealed a TSH of 0.641.  Lipid studies were normal with a total close to 183, triglycerides 118, HDL 65, LDL cholesterol 94.  Presently, she remains active.  She denies recent chest pain.  She denies presyncope or syncope.  She has purposefully lost 30 pounds over the past 2 years.  Past Medical History  Diagnosis Date  . Hyperlipidemia     Past Surgical History  Procedure Laterality Date  . Transthoracic echocardiogram  11/12/2010    EF=>55%, moderate calcification of aortic valve leaflets, no MVP seen, normal LV thickness   . Nm myocar perf wall motion  11/03/2009    protocol: Persantine, no evidence of ischemia/infarct, post EF67%  . Cardiac catheterization  02/22/2008    no significant CAD, 50% narrowing of proximal mid-dom. right Coronary artery. medical therapy along with antiplatlet therapy     Not on File  Current Outpatient Prescriptions  Medication Sig Dispense Refill  . aspirin 81 MG tablet Take 81 mg by mouth daily.      . Biotin 5000 MCG CAPS Take 5 mg by mouth daily.      . Calcium-Magnesium-Zinc 167-83-8 MG TABS  Take 1 tablet by mouth daily.      . clonazePAM (KLONOPIN) 0.5 MG tablet Take 0.5 mg by mouth daily.      Marland Kitchen estrogens, conjugated, (PREMARIN) 1.25 MG tablet Take 1.25 mg by mouth daily.      . Flaxseed, Linseed, (FLAXSEED OIL PO) Take 1 capsule by mouth daily.      Marland Kitchen gabapentin (NEURONTIN) 300 MG capsule Take 300 mg by mouth as needed.      . meloxicam (MOBIC) 15 MG tablet Take 15 mg by mouth daily.      . nebivolol (BYSTOLIC) 5 MG tablet Take 1 tablet (5 mg total) by mouth daily.  30 tablet  2  . vitamin E  1000 UNIT capsule Take 1,000 Units by mouth daily.       No current facility-administered medications for this visit.    History   Social History  . Marital Status: Married    Spouse Name: N/A    Number of Children: N/A  . Years of Education: N/A   Occupational History  . Not on file.   Social History Main Topics  . Smoking status: Former Smoker -- 1.00 packs/day for 30 years    Types: Cigarettes  . Smokeless tobacco: Never Used  . Alcohol Use: Yes     Comment: 1 glass per week  . Drug Use: Not on file  . Sexual Activity: Not on file   Other Topics Concern  . Not on file   Social History Narrative  . No narrative on file    Family History  Problem Relation Age of Onset  . Cancer Mother   . Hypertension Sister   . Diabetes Brother   . Hyperlipidemia Brother   . Heart disease Maternal Grandmother   . Diabetes Maternal Grandmother   . Diabetes Maternal Grandfather   . Heart disease Maternal Grandfather   . Tuberculosis Paternal Grandmother   . Stroke Paternal Grandfather   . Diabetes Brother   . Hyperlipidemia Brother   . Heart disease Brother   . Hyperlipidemia Son     ROS General: Negative; No fevers, chills, or night sweats HEENT: Positive for droopy eyelids, status post surgery done last week; No changes in vision or hearing, sinus congestion, difficulty swallowing Pulmonary: Negative; No cough, wheezing, shortness of breath, hemoptysis Cardiovascular: Negative; No chest pain, presyncope, syncope, palpatations GI: Negative; No nausea, vomiting, diarrhea, or abdominal pain GU: Negative; No dysuria, hematuria, or difficulty voiding Musculoskeletal: Negative; no myalgias, joint pain, or weakness Hematologic: Negative; no easy bruising, bleeding Endocrine: Negative; no heat/cold intolerance; no diabetes, Neuro: Negative; no changes in balance, headaches Skin: Negative; No rashes or skin lesions Psychiatric: Negative; No behavioral problems,  depression Sleep: Negative; No snoring,  daytime sleepiness, hypersomnolence, bruxism, restless legs, hypnogognic hallucinations. Other comprehensive 14 point system review is negative    Physical Exam BP 126/65  Pulse 59  Ht $R'5\' 5"'if$  (1.651 m)  Wt 165 lb 8 oz (75.07 kg)  BMI 27.54 kg/m2 General: Alert, oriented, no distress.  Skin: normal turgor, no rashes, warm and dry HEENT: Normocephalic, atraumatic. Pupils equal round and reactive to light; sclera anicteric; extraocular muscles intact, No lid lag; Nose without nasal septal hypertrophy; Mouth/Parynx benign; Mallinpatti scale 2 Neck:  Right carotid bruit versus transmitted murmur;No JVD; normal carotid upstroke Lungs: clear to ausculatation and percussion bilaterally; no wheezing or rales, normal inspiratory and expiratory effort Chest wall: without tenderness to palpitation Heart: PMI not displaced, RRR, s1 s2 normal, 4-4/0 systolic murmur early  peaking in the aortic region and 1-3/2 systolic murmur at the apex, No diastolic murmur, no rubs, gallops, thrills, or heaves Abdomen: soft, nontender; no hepatosplenomehaly, BS+; abdominal aorta nontender and not dilated by palpation. Back: no CVA tenderness Pulses: 2+  Musculoskeletal: full range of motion, normal strength, no joint deformities, she is status post lumbar surgery Extremities: Pulses 2+, no clubbing cyanosis or edema, Homan's sign negative  Neurologic: grossly nonfocal; Cranial nerves grossly wnl Psychologic: Normal mood and affect    11/19/13 ECG (independently read by me): Normal sinus rhythm at 61 beats per minute.  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 This calculation has not been validated in all clinical situations. eGFR's persistently <60 mL/min signify possible Chronic Kidney Disease. 10/21/2009 1044     Hepatic Function Panel  No results found for this basename: prot,  albumin,  ast,  alt,  alkphos,  bilitot,  bilidir,  ibili     CBC    Component Value Date/Time   WBC 8.0 10/21/2009 1044   RBC 4.43 10/21/2009 1044   HGB 15.3* 01/21/2010 0852   HCT 45.0 01/21/2010 0852   PLT 171 10/21/2009 1044   MCV 91.9 10/21/2009 1044   MCHC 35.5 10/21/2009 1044   RDW 14.2 10/21/2009 1044   LYMPHSABS 2.7 10/21/2009 1044   MONOABS 0.7 10/21/2009 1044   EOSABS 0.2 10/21/2009 1044   BASOSABS 0.1 10/21/2009 1044     BNP No results found for this basename: probnp    Lipid Panel     Component Value Date/Time   CHOL 183 01/14/2014 0928     RADIOLOGY: No results found.    ASSESSMENT AND PLAN:  Ms Feijoo is a 68 year old female , who has mild coronary obstructive disease that was noted at cardiac catheterization in 2009 after she experienced nitrate responsive chest pain.  A subsequent nuclear study 2 years later continued to show normal perfusion.  She does have a cardiac murmur suggestive of aortic stenosis and her most recent echo Doppler study confirmed mild aortic stenosis with a peak instantaneous gradient of 20 mm, mean gradient of 10, with mild restrictive mobility, and mildly calcified leaflets.  I reviewed with her the natural history of aortic valve stenosis and in particular discussed potential symptomatology.  Her carotid study demonstrates mild carotid plaque, but I suspect her carotid sounds are predominantly due to her transmitted aortic stenosis murmur.  I commended her on her 38 pound weight loss over the past 2 years.  She continues to do well with normal blood pressure and is tolerating Bystolic 5 mg  daily.  She is on aspirin 81 mg.  I reviewed her laboratory with her in detail.  She is planning to continue to lose additional weight.  I will see her in one year for followup evaluation or sooner if problems arise.  Time spent: 25 minutes   Troy Sine, MD, [facility]  02/27/2014 11:24  AM    

## 2014-02-27 NOTE — Patient Instructions (Signed)
Your physician wants you to follow-up in: 1 year. You will receive a reminder letter in the mail two months in advance. If you don't receive a letter, please call our office to schedule the follow-up appointment. No changes were made today in your therapy. 

## 2014-03-26 ENCOUNTER — Other Ambulatory Visit: Payer: Self-pay

## 2014-03-26 DIAGNOSIS — Z1231 Encounter for screening mammogram for malignant neoplasm of breast: Secondary | ICD-10-CM

## 2014-04-18 ENCOUNTER — Other Ambulatory Visit: Payer: Self-pay | Admitting: Cardiovascular Disease

## 2014-04-18 NOTE — Telephone Encounter (Signed)
Rx was sent to pharmacy electronically. 

## 2014-04-22 ENCOUNTER — Ambulatory Visit
Admission: RE | Admit: 2014-04-22 | Discharge: 2014-04-22 | Disposition: A | Discharge status: Home / Self Care | Payer: 59 | Source: Ambulatory Visit

## 2014-04-22 DIAGNOSIS — Z1231 Encounter for screening mammogram for malignant neoplasm of breast: Secondary | ICD-10-CM

## 2014-04-29 ENCOUNTER — Encounter: Payer: Self-pay | Admitting: Cardiovascular Disease

## 2014-06-19 ENCOUNTER — Telehealth: Payer: Self-pay | Admitting: Cardiovascular Disease

## 2014-06-19 MED ORDER — NEBIVOLOL HCL 5 MG PO TABS: 5.0000 mg | ORAL_TABLET | Freq: Every day | ORAL | Status: DC

## 2014-06-19 NOTE — Telephone Encounter (Signed)
bystolic refilled G/25 day supply e-scribed to [facility] [address].  Patient notified.

## 2014-06-19 NOTE — Telephone Encounter (Signed)
Pt called in wanting to know if she could be her Bystolic prescription in a 90 day supply. Please call the [facility] on [address]  Thanks

## 2015-03-13 ENCOUNTER — Ambulatory Visit (INDEPENDENT_AMBULATORY_CARE_PROVIDER_SITE_OTHER): Payer: Medicare Other | Admitting: Cardiovascular Disease

## 2015-03-13 VITALS — BP 122/62 | HR 58 | Ht 65.5 in | Wt 168.9 lb

## 2015-03-13 DIAGNOSIS — I452 Bifascicular block: Secondary | ICD-10-CM

## 2015-03-13 DIAGNOSIS — I2583 Coronary atherosclerosis due to lipid rich plaque: Secondary | ICD-10-CM

## 2015-03-13 DIAGNOSIS — I2510 Atherosclerotic heart disease of native coronary artery without angina pectoris: Secondary | ICD-10-CM | POA: Diagnosis not present

## 2015-03-13 DIAGNOSIS — I4510 Unspecified right bundle-branch block: Secondary | ICD-10-CM | POA: Diagnosis not present

## 2015-03-13 DIAGNOSIS — I350 Nonrheumatic aortic (valve) stenosis: Secondary | ICD-10-CM | POA: Diagnosis not present

## 2015-03-13 NOTE — Patient Instructions (Signed)
Your physician wants you to follow-up in: 6 months or sooner if needed. You will receive a reminder letter in the mail two months in advance. If you don't receive a letter, please call our office to schedule the follow-up appointment.  Your physician has requested that you have an echocardiogram. Echocardiography is a painless test that uses sound waves to create images of your heart. It provides your doctor with information about the size and shape of your heart and how well your heart's chambers and valves are working. This procedure takes approximately one hour. There are no restrictions for this procedure. This will be done prior to your next office appointment.

## 2015-03-15 ENCOUNTER — Encounter: Payer: Self-pay | Admitting: Cardiovascular Disease

## 2015-03-15 DIAGNOSIS — I452 Bifascicular block: Secondary | ICD-10-CM

## 2015-03-15 HISTORY — DX: Bifascicular block: I45.2

## 2015-03-15 NOTE — Progress Notes (Signed)
Patient ID: [name], female   DOB: July 17, 1945, 69 y.o.   MRN: [id number]     HPI: [name] is a 69 y.o. female who presents to the office today for a one yearfollow up cardiology evaluation.  [name] has documented mild coronary obstructive disease by cardiac catheterization in 2009 with a  50% RCA stenosis and 20% stenosis in the proximal left circumflex coronary artery.  In the past, she has a history of atrial arrhythmia, treated initially with metoprolol, but due to significant fatigability this was switched to Bystolic, for which he has tolerated well.  In 2011 a nuclear perfusion study showed normal perfusion.  An echo Doppler study in May 2012, showed normal systolic function with suggestion of grade 1 diastolic dysfunction.  There is mild mitral annular calcification trace MR, trace, TR, and she had moderate calcification of her aortic valve leaflets with mild valvular aortic stenosis and mild aortic regurgitation.  Mean gradient was 9.5 mm and peak gradient was 20 mm across her aortic valve.  She sees Dr. Melford Aase for primary care.  She had been started on low-dose simvastatin in the past, but apparently was only on the short-term and stopped taking it.  She underwent eye surgery to repair her droopy eyelids.  Last week.  The operative CBC and chemistry profile were normal.  She tolerated her surgery well.    A carotid duplex scan revealed mild bilateral carotid plaque without significant stenosis.  An echo Doppler study on 01/14/2014  showed normal systolic function with mild grade 1 diastolic dysfunction.  There was evidence for mild aortic valve stenosis with trivial aortic insufficiency with a mean transvalvular aortic gradient of 10 mm and peak gradient of 20 mm.  The left atrium was mildly dilated.  She had mild mitral annular calcification with mild MR.  Over the past year, she denies chest pain, shortness of breath, or dizziness. She is unaware of any change in  exercise tolerance. Sincerely, she today she complains of her right toe being tender.  She has been taking meloxicam 15 mg.  She presents for one-year evaluation.  Past Medical History  Diagnosis Date  . Hyperlipidemia     Past Surgical History  Procedure Laterality Date  . Transthoracic echocardiogram  11/12/2010    EF=>55%, moderate calcification of aortic valve leaflets, no MVP seen, normal LV thickness   . Nm myocar perf wall motion  11/03/2009    protocol: Persantine, no evidence of ischemia/infarct, post EF67%  . Cardiac catheterization  02/22/2008    no significant CAD, 50% narrowing of proximal mid-dom. right Coronary artery. medical therapy along with antiplatlet therapy     Allergies  Allergen Reactions  . Sulfa Antibiotics Rash    Current Outpatient Prescriptions  Medication Sig Dispense Refill  . aspirin 81 MG tablet Take 81 mg by mouth daily.    . Biotin 5000 MCG CAPS Take 5 mg by mouth daily.    . Calcium-Magnesium-Zinc 167-83-8 MG TABS Take 1 tablet by mouth daily.    . clonazePAM (KLONOPIN) 0.5 MG tablet Take 0.5 mg by mouth daily.    Marland Kitchen estrogens, conjugated, (PREMARIN) 1.25 MG tablet Take 1.25 mg by mouth daily.    Marland Kitchen gabapentin (NEURONTIN) 300 MG capsule Take 300 mg by mouth as needed.    . meloxicam (MOBIC) 15 MG tablet Take 15 mg by mouth daily.    . nebivolol (BYSTOLIC) 5 MG tablet Take 1 tablet (5 mg total) by mouth daily. [place]  tablet 2  . Vitamin D, Cholecalciferol, 400 UNITS CAPS Take 1 capsule by mouth daily.    . vitamin E 1000 UNIT capsule Take 1,000 Units by mouth daily.     No current facility-administered medications for this visit.    Social History   Social History  . Marital Status: Widowed    Spouse Name: N/A  . Number of Children: N/A  . Years of Education: N/A   Occupational History  . Not on file.   Social History Main Topics  . Smoking status: Former Smoker -- 1.00 packs/day for 30 years    Types: Cigarettes  . Smokeless tobacco:  Never Used  . Alcohol Use: Yes     Comment: 1 glass per week  . Drug Use: Not on file  . Sexual Activity: Not on file   Other Topics Concern  . Not on file   Social History Narrative    Family History  Problem Relation Age of Onset  . Cancer Mother   . Hypertension Sister   . Diabetes Brother   . Hyperlipidemia Brother   . Heart disease Maternal Grandmother   . Diabetes Maternal Grandmother   . Diabetes Maternal Grandfather   . Heart disease Maternal Grandfather   . Tuberculosis Paternal Grandmother   . Stroke Paternal Grandfather   . Diabetes Brother   . Hyperlipidemia Brother   . Heart disease Brother   . Hyperlipidemia Son     ROS General: Negative; No fevers, chills, or night sweats HEENT: Positive for droopy eyelids, status post surgery done last week; No changes in vision or hearing, sinus congestion, difficulty swallowing Pulmonary: Negative; No cough, wheezing, shortness of breath, hemoptysis Cardiovascular: Negative; No chest pain, presyncope, syncope, palpatations GI: Negative; No nausea, vomiting, diarrhea, or abdominal pain GU: Negative; No dysuria, hematuria, or difficulty voiding Musculoskeletal: Negative; no myalgias, joint pain, or weakness Hematologic: Negative; no easy bruising, bleeding Endocrine: Negative; no heat/cold intolerance; no diabetes, Neuro: Negative; no changes in balance, headaches Skin: Negative; No rashes or skin lesions Psychiatric: Negative; No behavioral problems, depression Sleep: Negative; No snoring,  daytime sleepiness, hypersomnolence, bruxism, restless legs, hypnogognic hallucinations. Other comprehensive 14 point system review is negative    Physical Exam BP 122/62 mmHg  Pulse 58  Ht 5' 5.5" (1.664 m)  Wt 168 lb 14.4 oz (76.613 kg)  BMI 27.67 kg/m2   Wt Readings from Last 3 Encounters:  03/13/15 168 lb 14.4 oz (76.613 kg)  02/27/14 165 lb 8 oz (75.07 kg)  11/19/13 167 lb (75.751 kg)   General: Alert, oriented, no  distress.  Skin: normal turgor, no rashes, warm and dry HEENT: Normocephalic, atraumatic. Pupils equal round and reactive to light; sclera anicteric; extraocular muscles intact, No lid lag; Nose without nasal septal hypertrophy; Mouth/Parynx benign; Mallinpatti scale 2 Neck:  Right carotid bruit versus transmitted murmur; No JVD; normal carotid upstroke Lungs: clear to ausculatation and percussion bilaterally; no wheezing or rales, normal inspiratory and expiratory effort Chest wall: without tenderness to palpitation Heart: PMI not displaced, RRR, s1 s2 normal, 2-7/2 systolic murmur early peaking in the aortic region and 5-3/6 systolic murmur at the apex, No diastolic murmur, no rubs, gallops, thrills, or heaves Abdomen: soft, nontender; no hepatosplenomehaly, BS+; abdominal aorta nontender and not dilated by palpation. Back: no CVA tenderness Pulses: 2+  Musculoskeletal: full range of motion, normal strength, no joint deformities, she is status post lumbar surgery Extremities:  Mild erythema and focal tenderness over the first metatarsal joint of the great toe.  Suspicious for gout.  Pulses 2+, Neurologic: grossly nonfocal; Cranial nerves grossly wnl Psychologic: Normal mood and affect  ECG (independently read by me):  Sinus bradycardia 58 bpm.  Right bundle-branch block with repolarization changes, left anterior hemiblock  11/19/13 ECG (independently read by me): Normal sinus rhythm at 61 beats per minute.  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 CBC Latest Ref Rng 01/21/2010 10/21/2009  WBC 4.0 - 10.5 K/uL  - 8.0  Hemoglobin 12.0 - 15.0 g/dL 15.3(H) 14.4  Hematocrit 36.0 - 46.0 % 45.0 40.7  Platelets 150 - 400 K/uL - 171   Lab Results  Component Value Date   MCV 91.9 10/21/2009   Lab Results  Component Value Date   TSH 0.641 01/14/2014   Lipid Panel     Component Value Date/Time   CHOL 183 01/14/2014 0928   TRIG 118 01/14/2014 0928   HDL 65 01/14/2014 0928   CHOLHDL 2.8 01/14/2014 0928   VLDL 24 01/14/2014 0928   LDLCALC 94 01/14/2014 0928     RADIOLOGY: No results found.    ASSESSMENT AND PLAN:  [name] is a 69 year old Caucasian female  who has mild coronary obstructive disease that was noted at cardiac catheterization in 2009 after she experienced nitrate responsive chest pain.  A subsequent nuclear study 2 years later continued to show normal perfusion.  She has a cardiac murmur suggestive of aortic stenosis and her most recent echo Doppler study confirmed mild aortic stenosis with a peak instantaneous gradient of 20 mm, mean gradient of 10, with mild restrictive mobility, and mildly calcified leaflets.  I  Have reviewed with her the natural history of aortic valve stenosis and in particular discussed potential symptomatology.  Her carotid study has demonstrated mild carotid plaque, but I suspect her carotid sounds are predominantly due to her transmitted aortic stenosis murmur.  She has been successful with weight loss and has lost approximately 30 pounds over the past 3 years. Her blood pressure today is controlled.  She does have a focal region of erythema with tenderness at the base of her right great toe suggestive of possible gout.  I suggested the possibility of adding colchicine to her regimen.  She has been taking meloxicam.  She sees Dr. Melford Aase for primary care and will plan to see him within the next day for evaluation of possible gout. 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  I  will see her in 6 months and prior to that office visit she will undergo a follow-up echo Doppler study to reassess her aortic valve.    Time spent: 25 minutes   Troy Sine, MD, [facility]  03/15/2015 1:49 PM

## 2015-03-17 ENCOUNTER — Other Ambulatory Visit: Payer: Self-pay | Admitting: Cardiovascular Disease

## 2015-03-17 NOTE — Telephone Encounter (Signed)
Rx request sent to pharmacy.  

## 2015-03-26 ENCOUNTER — Other Ambulatory Visit: Payer: Self-pay

## 2015-03-26 DIAGNOSIS — Z1231 Encounter for screening mammogram for malignant neoplasm of breast: Secondary | ICD-10-CM

## 2015-04-29 ENCOUNTER — Encounter (HOSPITAL_COMMUNITY): Payer: Self-pay | Admitting: Emergency Medicine

## 2015-04-29 ENCOUNTER — Emergency Department (HOSPITAL_COMMUNITY): Payer: Medicare Other

## 2015-04-29 ENCOUNTER — Observation Stay (HOSPITAL_COMMUNITY)
Admission: EM | Admit: 2015-04-29 | Discharge: 2015-04-30 | Disposition: A | Discharge status: Home / Self Care | Payer: Medicare Other | Attending: Internal Medicine | Admitting: Internal Medicine

## 2015-04-29 DIAGNOSIS — Z7982 Long term (current) use of aspirin: Secondary | ICD-10-CM | POA: Diagnosis not present

## 2015-04-29 DIAGNOSIS — F419 Anxiety disorder, unspecified: Secondary | ICD-10-CM | POA: Insufficient documentation

## 2015-04-29 DIAGNOSIS — J329 Chronic sinusitis, unspecified: Secondary | ICD-10-CM | POA: Diagnosis not present

## 2015-04-29 DIAGNOSIS — I4510 Unspecified right bundle-branch block: Secondary | ICD-10-CM | POA: Diagnosis not present

## 2015-04-29 DIAGNOSIS — R55 Syncope and collapse: Principal | ICD-10-CM | POA: Diagnosis present

## 2015-04-29 DIAGNOSIS — Z87891 Personal history of nicotine dependence: Secondary | ICD-10-CM | POA: Insufficient documentation

## 2015-04-29 DIAGNOSIS — F411 Generalized anxiety disorder: Secondary | ICD-10-CM | POA: Diagnosis present

## 2015-04-29 DIAGNOSIS — E785 Hyperlipidemia, unspecified: Secondary | ICD-10-CM | POA: Insufficient documentation

## 2015-04-29 DIAGNOSIS — I350 Nonrheumatic aortic (valve) stenosis: Secondary | ICD-10-CM | POA: Diagnosis not present

## 2015-04-29 DIAGNOSIS — I10 Essential (primary) hypertension: Secondary | ICD-10-CM | POA: Diagnosis not present

## 2015-04-29 DIAGNOSIS — I2510 Atherosclerotic heart disease of native coronary artery without angina pectoris: Secondary | ICD-10-CM | POA: Diagnosis not present

## 2015-04-29 DIAGNOSIS — I6523 Occlusion and stenosis of bilateral carotid arteries: Secondary | ICD-10-CM | POA: Insufficient documentation

## 2015-04-29 DIAGNOSIS — Z79899 Other long term (current) drug therapy: Secondary | ICD-10-CM | POA: Insufficient documentation

## 2015-04-29 DIAGNOSIS — Z8249 Family history of ischemic heart disease and other diseases of the circulatory system: Secondary | ICD-10-CM | POA: Diagnosis not present

## 2015-04-29 LAB — CBC WITH DIFFERENTIAL/PLATELET
Basophils Absolute: 0.1 10*3/uL (ref 0.0–0.1)
Basophils Relative: 1 %
EOS ABS: 0.1 10*3/uL (ref 0.0–0.7)
Eosinophils Relative: 1 %
HCT: 41.5 % (ref 36.0–46.0)
HEMOGLOBIN: 13.7 g/dL (ref 12.0–15.0)
LYMPHS ABS: 2.5 10*3/uL (ref 0.7–4.0)
Lymphocytes Relative: 28 %
MCH: 30.6 pg (ref 26.0–34.0)
MCHC: 33 g/dL (ref 30.0–36.0)
MCV: 92.6 fL (ref 78.0–100.0)
MONOS PCT: 8 %
Monocytes Absolute: 0.7 10*3/uL (ref 0.1–1.0)
NEUTROS PCT: 62 %
Neutro Abs: 5.7 10*3/uL (ref 1.7–7.7)
Platelets: 238 10*3/uL (ref 150–400)
RBC: 4.48 MIL/uL (ref 3.87–5.11)
RDW: 14.1 % (ref 11.5–15.5)
WBC: 9.2 10*3/uL (ref 4.0–10.5)

## 2015-04-29 LAB — URINALYSIS, ROUTINE W REFLEX MICROSCOPIC
BILIRUBIN URINE: NEGATIVE
Glucose, UA: NEGATIVE mg/dL
Ketones, ur: NEGATIVE mg/dL
Nitrite: NEGATIVE
PH: 7.5 (ref 5.0–8.0)
Protein, ur: NEGATIVE mg/dL
SPECIFIC GRAVITY, URINE: 1.01 (ref 1.005–1.030)
Urobilinogen, UA: 0.2 mg/dL (ref 0.0–1.0)

## 2015-04-29 LAB — COMPREHENSIVE METABOLIC PANEL
ALT: 14 U/L (ref 14–54)
ANION GAP: 9 (ref 5–15)
AST: 27 U/L (ref 15–41)
Albumin: 3.6 g/dL (ref 3.5–5.0)
Alkaline Phosphatase: 33 U/L — ABNORMAL LOW (ref 38–126)
BUN: 12 mg/dL (ref 6–20)
CO2: 27 mmol/L (ref 22–32)
CREATININE: 0.83 mg/dL (ref 0.44–1.00)
Calcium: 9.1 mg/dL (ref 8.9–10.3)
Chloride: 99 mmol/L — ABNORMAL LOW (ref 101–111)
GFR calc non Af Amer: >60 mL/min (ref >60)
GLUCOSE: 135 mg/dL — AB (ref 65–99)
Potassium: 4.3 mmol/L (ref 3.5–5.1)
SODIUM: 135 mmol/L (ref 135–145)
TOTAL PROTEIN: 6.2 g/dL — AB (ref 6.5–8.1)
Total Bilirubin: 0.5 mg/dL (ref 0.3–1.2)

## 2015-04-29 LAB — URINE MICROSCOPIC-ADD ON

## 2015-04-29 LAB — I-STAT TROPONIN, ED: TROPONIN I, POC: 0.01 ng/mL (ref 0.00–0.08)

## 2015-04-29 NOTE — ED Notes (Signed)
Patient comes today with a witness syncopal episode while sitting at a table. Patient alert & oriented x4. Patient able to move all extremities on arrival. Patient complained of Abd pain and nausea before LOC. Patient given 8 mg of Zofran by EMS .  Patient denies nausea on arrival. Patient complain of 8/10 headache has a HX of migraine.

## 2015-04-29 NOTE — ED Notes (Signed)
Patient sent to CT by another staff member.

## 2015-04-30 ENCOUNTER — Other Ambulatory Visit: Payer: Self-pay | Admitting: *Deleted

## 2015-04-30 ENCOUNTER — Other Ambulatory Visit (HOSPITAL_COMMUNITY): Payer: Medicare Other

## 2015-04-30 ENCOUNTER — Telehealth: Payer: Self-pay | Admitting: *Deleted

## 2015-04-30 ENCOUNTER — Ambulatory Visit: Payer: Medicare Other

## 2015-04-30 ENCOUNTER — Observation Stay (HOSPITAL_COMMUNITY): Payer: Medicare Other

## 2015-04-30 ENCOUNTER — Observation Stay (HOSPITAL_BASED_OUTPATIENT_CLINIC_OR_DEPARTMENT_OTHER): Payer: Medicare Other

## 2015-04-30 DIAGNOSIS — J329 Chronic sinusitis, unspecified: Secondary | ICD-10-CM

## 2015-04-30 DIAGNOSIS — I2510 Atherosclerotic heart disease of native coronary artery without angina pectoris: Secondary | ICD-10-CM | POA: Diagnosis not present

## 2015-04-30 DIAGNOSIS — I350 Nonrheumatic aortic (valve) stenosis: Secondary | ICD-10-CM

## 2015-04-30 DIAGNOSIS — F411 Generalized anxiety disorder: Secondary | ICD-10-CM | POA: Diagnosis present

## 2015-04-30 DIAGNOSIS — I4510 Unspecified right bundle-branch block: Secondary | ICD-10-CM

## 2015-04-30 DIAGNOSIS — R55 Syncope and collapse: Secondary | ICD-10-CM

## 2015-04-30 DIAGNOSIS — J328 Other chronic sinusitis: Secondary | ICD-10-CM

## 2015-04-30 LAB — COMPREHENSIVE METABOLIC PANEL
ALK PHOS: 32 U/L — AB (ref 38–126)
ALT: 14 U/L (ref 14–54)
AST: 24 U/L (ref 15–41)
Albumin: 3.3 g/dL — ABNORMAL LOW (ref 3.5–5.0)
Anion gap: 10 (ref 5–15)
BILIRUBIN TOTAL: 0.5 mg/dL (ref 0.3–1.2)
BUN: 12 mg/dL (ref 6–20)
CALCIUM: 8.6 mg/dL — AB (ref 8.9–10.3)
CO2: 26 mmol/L (ref 22–32)
CREATININE: 0.84 mg/dL (ref 0.44–1.00)
Chloride: 100 mmol/L — ABNORMAL LOW (ref 101–111)
GFR calc non Af Amer: >60 mL/min (ref >60)
GLUCOSE: 128 mg/dL — AB (ref 65–99)
Potassium: 3.9 mmol/L (ref 3.5–5.1)
SODIUM: 136 mmol/L (ref 135–145)
TOTAL PROTEIN: 5.6 g/dL — AB (ref 6.5–8.1)

## 2015-04-30 LAB — CBC
HCT: 39.1 % (ref 36.0–46.0)
Hemoglobin: 12.9 g/dL (ref 12.0–15.0)
MCH: 30.7 pg (ref 26.0–34.0)
MCHC: 33 g/dL (ref 30.0–36.0)
MCV: 93.1 fL (ref 78.0–100.0)
PLATELETS: 232 10*3/uL (ref 150–400)
RBC: 4.2 MIL/uL (ref 3.87–5.11)
RDW: 14.3 % (ref 11.5–15.5)
WBC: 8.1 10*3/uL (ref 4.0–10.5)

## 2015-04-30 LAB — TROPONIN I
Troponin I: <0.03 ng/mL (ref <0.031)
Troponin I: <0.03 ng/mL (ref <0.031)
Troponin I: <0.03 ng/mL (ref <0.031)

## 2015-04-30 LAB — D-DIMER, QUANTITATIVE (NOT AT ARMC): D DIMER QUANT: 0.31 ug{FEU}/mL (ref 0.00–0.48)

## 2015-04-30 LAB — PROTIME-INR
INR: 1.07 (ref 0.00–1.49)
PROTHROMBIN TIME: 14.1 s (ref 11.6–15.2)

## 2015-04-30 LAB — GLUCOSE, CAPILLARY: Glucose-Capillary: 113 mg/dL — ABNORMAL HIGH (ref 65–99)

## 2015-04-30 MED ORDER — ACETAMINOPHEN 650 MG RE SUPP: 650.0000 mg | Freq: Four times a day (QID) | RECTAL | Status: DC | PRN

## 2015-04-30 MED ORDER — ONDANSETRON HCL 4 MG/2ML IJ SOLN: 4.0000 mg | Freq: Four times a day (QID) | INTRAMUSCULAR | Status: DC | PRN

## 2015-04-30 MED ORDER — ASPIRIN EC 81 MG PO TBEC
81.0000 mg | DELAYED_RELEASE_TABLET | Freq: Every day | ORAL | Status: DC
  Administered 2015-04-30: 81 mg via ORAL
  Filled 2015-04-30: qty 1

## 2015-04-30 MED ORDER — MELOXICAM 15 MG PO TABS
15.0000 mg | ORAL_TABLET | Freq: Every day | ORAL | Status: DC
  Administered 2015-04-30: 15 mg via ORAL
  Filled 2015-04-30: qty 1

## 2015-04-30 MED ORDER — SODIUM CHLORIDE 0.9 % IV SOLN
INTRAVENOUS | Status: DC
  Administered 2015-04-30: 03:00:00 via INTRAVENOUS

## 2015-04-30 MED ORDER — ENOXAPARIN SODIUM 40 MG/0.4ML ~~LOC~~ SOLN
40.0000 mg | SUBCUTANEOUS | Status: DC
  Filled 2015-04-30: qty 0.4

## 2015-04-30 MED ORDER — SODIUM CHLORIDE 0.9 % IJ SOLN
3.0000 mL | Freq: Two times a day (BID) | INTRAMUSCULAR | Status: DC
  Administered 2015-04-30: 3 mL via INTRAVENOUS

## 2015-04-30 MED ORDER — ONDANSETRON HCL 4 MG PO TABS: 4.0000 mg | ORAL_TABLET | Freq: Four times a day (QID) | ORAL | Status: DC | PRN

## 2015-04-30 MED ORDER — CLONAZEPAM 0.5 MG PO TABS: 0.5000 mg | ORAL_TABLET | Freq: Every day | ORAL | Status: DC | PRN

## 2015-04-30 MED ORDER — ACETAMINOPHEN 325 MG PO TABS: 650.0000 mg | ORAL_TABLET | Freq: Four times a day (QID) | ORAL | Status: DC | PRN

## 2015-04-30 NOTE — Progress Notes (Signed)
Echo shows normal LV function    BP / EKG check  Next week  F/U with PA or Dr Claiborne Billings in 4 wks  Event montior  Our office will call her with all of the above  D/C bystolic   Ambulate  OK to D/C if feels OK    No driving for 6 months unless identifiable cause found.    [name]

## 2015-04-30 NOTE — Consult Note (Signed)
Cardiologist: Claiborne Billings Reason for Consult: Syncope Referring Physician: Tat  [name] is an 69 y.o. female.  HPI:   [name] has coronary obstructive disease by cardiac catheterization in 2009 with a 50% RCA stenosis and 20% stenosis in the proximal left circumflex coronary artery. In the past, she has a history of atrial arrhythmia, treated initially with metoprolol, but due to significant fatigability this was switched to Bystolic, for which he has tolerated well. In 2011 a nuclear perfusion study showed normal perfusion.  An echo Doppler study on 01/14/2014 showed normal systolic function with mild grade 1 diastolic dysfunction. There was evidence for mild aortic valve stenosis with trivial aortic insufficiency with a mean transvalvular aortic gradient of 10 mm and peak gradient of 20 mm. The left atrium was mildly dilated. She had mild mitral annular calcification with mild MR.  She had been started on low-dose simvastatin in the past, but apparently was only on the short-term and stopped taking it. She underwent eye surgery to repair her droopy eyelids 03/2015. The operative CBC and chemistry profile were normal. She tolerated her surgery well. A carotid doppler scan 01/2014 revealed mild bilateral carotid plaque without significant stenosis.  Patient presents after having a syncopal episode.  She had a normal noncontrast CT head.  Negative troponin.  She reports sitting at the dinner table at a meeting between two of her friends.  They had already eaten and were listening to a speaker.  The next thing she remembers is her friends calling her name. They reported she was initially flushed in the face then pale.  Once she regained consciousness, she had a "horrible" headache.  She had no dizziness or sensation that something was wrong prior to passing out.  She denies nausea, vomiting, fever, chest pain, shortness of breath, orthopnea,  PND, cough, congestion, abdominal  pain, hematochezia, melena, lower extremity edema.  She also reported sinus issues/drainage lately and apparently was taking an antihistamine.     Past Medical History  Diagnosis Date  . Hyperlipidemia     Past Surgical History  Procedure Laterality Date  . Transthoracic echocardiogram  11/12/2010    EF=>55%, moderate calcification of aortic valve leaflets, no MVP seen, normal LV thickness   . Nm myocar perf wall motion  11/03/2009    protocol: Persantine, no evidence of ischemia/infarct, post EF67%  . Cardiac catheterization  02/22/2008    no significant CAD, 50% narrowing of proximal mid-dom. right Coronary artery. medical therapy along with antiplatlet therapy     Family History  Problem Relation Age of Onset  . Cancer Mother   . Hypertension Sister   . Diabetes Brother   . Hyperlipidemia Brother   . Heart disease Maternal Grandmother   . Diabetes Maternal Grandmother   . Diabetes Maternal Grandfather   . Heart disease Maternal Grandfather   . Tuberculosis Paternal Grandmother   . Stroke Paternal Grandfather   . Diabetes Brother   . Hyperlipidemia Brother   . Heart disease Brother   . Hyperlipidemia Son     Social History:  reports that she has quit smoking. Her smoking use included Cigarettes. She has a 30 pack-year smoking history. She has never used smokeless tobacco. She reports that she drinks alcohol. Her drug history is not on file.  Allergies:  Allergies  Allergen Reactions  . Sulfa Antibiotics Rash    Medications:  Scheduled Meds: . aspirin EC  81 mg Oral Daily  . enoxaparin (LOVENOX) injection  40  mg Subcutaneous Q24H  . meloxicam  15 mg Oral Daily  . sodium chloride  3 mL Intravenous Q12H   Continuous Infusions: . sodium chloride 75 mL/hr at 04/30/15 0310   PRN Meds:.acetaminophen **OR** acetaminophen, clonazePAM, ondansetron **OR** ondansetron (ZOFRAN) IV   Results for orders placed or performed during the hospital encounter of 04/29/15 (from  the past 48 hour(s))  I-Stat Troponin, ED (not at [facility])     Status: None   Collection Time: 04/29/15 10:14 PM  Result Value Ref Range   Troponin i, poc 0.01 0.00 - 0.08 ng/mL   Comment 3            Comment: Due to the release kinetics of cTnI, a negative result within the first hours of the onset of symptoms does not rule out myocardial infarction with certainty. If myocardial infarction is still suspected, repeat the test at appropriate intervals.   CBC with Differential     Status: None   Collection Time: 04/29/15 10:18 PM  Result Value Ref Range   WBC 9.2 4.0 - 10.5 K/uL   RBC 4.48 3.87 - 5.11 MIL/uL   Hemoglobin 13.7 12.0 - 15.0 g/dL   HCT 41.5 36.0 - 46.0 %   MCV 92.6 78.0 - 100.0 fL   MCH 30.6 26.0 - 34.0 pg   MCHC 33.0 30.0 - 36.0 g/dL   RDW 14.1 11.5 - 15.5 %   Platelets 238 150 - 400 K/uL   Neutrophils Relative % 62 %   Neutro Abs 5.7 1.7 - 7.7 K/uL   Lymphocytes Relative 28 %   Lymphs Abs 2.5 0.7 - 4.0 K/uL   Monocytes Relative 8 %   Monocytes Absolute 0.7 0.1 - 1.0 K/uL   Eosinophils Relative 1 %   Eosinophils Absolute 0.1 0.0 - 0.7 K/uL   Basophils Relative 1 %   Basophils Absolute 0.1 0.0 - 0.1 K/uL  Comprehensive metabolic panel     Status: Abnormal   Collection Time: 04/29/15 10:18 PM  Result Value Ref Range   Sodium 135 135 - 145 mmol/L   Potassium 4.3 3.5 - 5.1 mmol/L   Chloride 99 (L) 101 - 111 mmol/L   CO2 27 22 - 32 mmol/L   Glucose, Bld 135 (H) 65 - 99 mg/dL   BUN 12 6 - 20 mg/dL   Creatinine, Ser 0.83 0.44 - 1.00 mg/dL   Calcium 9.1 8.9 - 10.3 mg/dL   Total Protein 6.2 (L) 6.5 - 8.1 g/dL   Albumin 3.6 3.5 - 5.0 g/dL   AST 27 15 - 41 U/L   ALT 14 14 - 54 U/L   Alkaline Phosphatase 33 (L) 38 - 126 U/L   Total Bilirubin 0.5 0.3 - 1.2 mg/dL   GFR calc non Af Amer >60 >60 mL/min   GFR calc Af Amer >60 >60 mL/min    Comment: (NOTE) The eGFR has been calculated using the CKD EPI equation. This calculation has not been validated in all clinical  situations. eGFR's persistently <60 mL/min signify possible Chronic Kidney Disease.    Anion gap 9 5 - 15  Urinalysis, Routine w reflex microscopic (not at [facility])     Status: Abnormal   Collection Time: 04/29/15 10:57 PM  Result Value Ref Range   Color, Urine YELLOW YELLOW   APPearance CLOUDY (A) CLEAR   Specific Gravity, Urine 1.010 1.005 - 1.030   pH 7.5 5.0 - 8.0   Glucose, UA NEGATIVE NEGATIVE mg/dL   Hgb urine dipstick SMALL (A)  NEGATIVE   Bilirubin Urine NEGATIVE NEGATIVE   Ketones, ur NEGATIVE NEGATIVE mg/dL   Protein, ur NEGATIVE NEGATIVE mg/dL   Urobilinogen, UA 0.2 0.0 - 1.0 mg/dL   Nitrite NEGATIVE NEGATIVE   Leukocytes, UA MODERATE (A) NEGATIVE  Urine microscopic-add on     Status: Abnormal   Collection Time: 04/29/15 10:57 PM  Result Value Ref Range   Squamous Epithelial / LPF MANY (A) RARE   WBC, UA 7-10 <3 WBC/hpf   RBC / HPF 3-6 <3 RBC/hpf   Bacteria, UA MANY (A) RARE   Urine-Other LESS THAN 10 mL OF URINE SUBMITTED   Troponin I     Status: None   Collection Time: 04/30/15  3:13 AM  Result Value Ref Range   Troponin I <0.03 <0.031 ng/mL    Comment:        NO INDICATION OF MYOCARDIAL INJURY.   Comprehensive metabolic panel     Status: Abnormal   Collection Time: 04/30/15  3:13 AM  Result Value Ref Range   Sodium 136 135 - 145 mmol/L   Potassium 3.9 3.5 - 5.1 mmol/L   Chloride 100 (L) 101 - 111 mmol/L   CO2 26 22 - 32 mmol/L   Glucose, Bld 128 (H) 65 - 99 mg/dL   BUN 12 6 - 20 mg/dL   Creatinine, Ser 0.84 0.44 - 1.00 mg/dL   Calcium 8.6 (L) 8.9 - 10.3 mg/dL   Total Protein 5.6 (L) 6.5 - 8.1 g/dL   Albumin 3.3 (L) 3.5 - 5.0 g/dL   AST 24 15 - 41 U/L   ALT 14 14 - 54 U/L   Alkaline Phosphatase 32 (L) 38 - 126 U/L   Total Bilirubin 0.5 0.3 - 1.2 mg/dL   GFR calc non Af Amer >60 >60 mL/min   GFR calc Af Amer >60 >60 mL/min    Comment: (NOTE) The eGFR has been calculated using the CKD EPI equation. This calculation has not been validated in all  clinical situations. eGFR's persistently <60 mL/min signify possible Chronic Kidney Disease.    Anion gap 10 5 - 15  CBC     Status: None   Collection Time: 04/30/15  3:13 AM  Result Value Ref Range   WBC 8.1 4.0 - 10.5 K/uL   RBC 4.20 3.87 - 5.11 MIL/uL   Hemoglobin 12.9 12.0 - 15.0 g/dL   HCT 39.1 36.0 - 46.0 %   MCV 93.1 78.0 - 100.0 fL   MCH 30.7 26.0 - 34.0 pg   MCHC 33.0 30.0 - 36.0 g/dL   RDW 14.3 11.5 - 15.5 %   Platelets 232 150 - 400 K/uL  Protime-INR     Status: None   Collection Time: 04/30/15  3:13 AM  Result Value Ref Range   Prothrombin Time 14.1 11.6 - 15.2 seconds   INR 1.07 0.00 - 1.49  D-dimer, quantitative (not at [facility])     Status: None   Collection Time: 04/30/15  3:13 AM  Result Value Ref Range   D-Dimer, Quant 0.31 0.00 - 0.48 ug/mL-FEU    Comment:        AT THE INHOUSE ESTABLISHED CUTOFF VALUE OF 0.48 ug/mL FEU, THIS ASSAY HAS BEEN DOCUMENTED IN THE LITERATURE TO HAVE A SENSITIVITY AND NEGATIVE PREDICTIVE VALUE OF AT LEAST 98 TO 99%.  THE TEST RESULT SHOULD BE CORRELATED WITH AN ASSESSMENT OF THE CLINICAL PROBABILITY OF DVT / VTE.   Glucose, capillary     Status: Abnormal   Collection Time:  04/30/15  6:37 AM  Result Value Ref Range   Glucose-Capillary 113 (H) 65 - 99 mg/dL  Troponin I     Status: None   Collection Time: 04/30/15  7:44 AM  Result Value Ref Range   Troponin I <0.03 <0.031 ng/mL    Comment:        NO INDICATION OF MYOCARDIAL INJURY.     Dg Chest 2 View  04/29/2015  CLINICAL DATA:  Acute onset of syncope, nausea and headache. Initial encounter. EXAM: CHEST  2 VIEW COMPARISON:  Chest radiograph performed 10/21/2009 FINDINGS: The lungs are well-aerated. Mild vascular congestion is noted. There is no evidence of focal opacification, pleural effusion or pneumothorax. The heart is borderline normal in size. No acute osseous abnormalities are seen. IMPRESSION: Mild vascular congestion noted.  Lungs remain grossly clear.  Electronically Signed   By: Garald Balding M.D.   On: 04/29/2015 21:57   Ct Head Wo Contrast  04/29/2015  CLINICAL DATA:  Unwitnessed syncopal episode while sitting at table. Abdominal pain. History of aortic valve stenosis, first degree heart block. EXAM: CT HEAD WITHOUT CONTRAST TECHNIQUE: Contiguous axial images were obtained from the base of the skull through the vertex without intravenous contrast. COMPARISON:  None. FINDINGS: The ventricles and sulci are normal for age less than expected within normal range for patient's age and though non-specific suggest sequelae of chronic small vessel ischemic disease. No acute large vascular territory infarcts. No abnormal extra-axial fluid collections. Basal cisterns are patent. Moderate calcific atherosclerosis of the carotid siphons. No skull fracture. The included ocular globes and orbital contents are non-suspicious. Status post bilateral ocular lens implants. The mastoid aircells and included paranasal sinuses are well-aerated. IMPRESSION: Normal noncontrast CT head for age. Electronically Signed   By: Elon Alas M.D.   On: 04/29/2015 22:35    Review of Systems  All other systems reviewed and are negative. The patient currently denies nausea, vomiting, fever, chest pain, shortness of breath, orthopnea, dizziness, PND, cough, congestion, abdominal pain, hematochezia, melena, lower extremity edema, claudication.  Blood pressure 115/43, pulse 63, temperature 97.6 F (36.4 C), temperature source Oral, resp. rate 18, height 5' 5"  (1.651 m), weight 166 lb 11.2 oz (75.615 kg), SpO2 98 %. Physical Exam  Nursing note and vitals reviewed.  Well nourished, well developed, in no acute distress HEENT: Pupils are equal round react to light accommodation extraocular movements are intact.  Neck: no JVDNo cervical lymphadenopathy. Cardiac: Regular rate and rhythm with 2/6 sys MM RSB Lungs:  clear to auscultation bilaterally, no wheezing, rhonchi or  rales Abd: soft, nontender, positive bowel sounds all quadrants Ext: no lower extremity edema.  2+ radial and dorsalis pedis pulses. Skin: warm and dry Neuro:  Grossly normal   Assessment/Plan: Principal Problem:   Syncope Active Problems:   CAD (coronary artery disease)   RBBB   Aortic valve stenosis   Anxiety state   Sinusitis, chronic   Faintness  The pt is a 69 yo female with a history of nonobstructive CAD, mild AS, RBBB, mild bilateral carotid stenosis who presented after having a syncopal episode.  No prodromal symptoms but had a severe HA after regaining consciousness.  EKG is unchanged.  Troponin is negative which would indicate it was probably not related to an arhythmia.  Nothing on telemetry thus far.  I do recommend she wear a Cardionet heart monitor for 30 days.  An echocardiogram is pending, however, I do not suspect her AS is the cause nor progression of carotid  disease.  It was very mild on doppler in 2015.  CT head negative for anything acute.    Tarri Fuller, [place] 04/30/2015, 10:14 AM   Patient seen and examined  Concerning spell as noted above  NO prodrome.  Not ortostatic  Hx nonobstructive CAD on last cath.  Mild AS.  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  No significant pauses on tele.  Getting echo.   Will review with EP  Consider 30 day event monitor off of b blocker No driving for 6 months or until cause found.  Dorris Carnes

## 2015-04-30 NOTE — Telephone Encounter (Signed)
Dr. Harrington Challenger called stating Ms. Chipman needs an event monitor placed tomorrow and no later than Friday 10/28  for syncopal episode.  Also wants her to be see as nurse visit next week for EKG and BP check then see App in about 3 wks.  Called pt and explained events that Dr. Harrington Challenger wanted to do.  She states she still has IV's and would I call her home phone and leave message on her machine.  Sent order for event monitor; scheduled for nurse visit 11/1 at 2 for EKG,BP check and 11/17 at 10:00 to see Sandria Senter. Lm on her machine.

## 2015-04-30 NOTE — Progress Notes (Signed)
*  PRELIMINARY RESULTS* Echocardiogram 2D Echocardiogram has been performed.  Leavy Cella 04/30/2015, 3:28 PM

## 2015-04-30 NOTE — ED Provider Notes (Signed)
CSN: [id number]     Arrival date & time 04/29/15  2103 History   First MD Initiated Contact with Patient 04/29/15 2111     Chief Complaint  Patient presents with  . Loss of Consciousness     (Consider location/radiation/quality/duration/timing/severity/associated sxs/prior Treatment) HPI Comments: 69 y.o. Female with history of hyperlipidemia presents for episode of syncope.  The patient reports that she was in her normal health today and had a good productive day but was at a meeting/dinner tonight when she suddenly passed out.  She said that she did not feel this coming on and that she woke up and did not know what happened.  Her colleagues told her that she had suddenly passed out and her head had gone on to the shoulder of the person next to her.  She reports that after the episode she noted a severe frontal headache and nausea.  She said both improved after she was given Zofran by EMS.  She denies chest pain, shortness of breath, palpitations.  She has never had symptoms like this before.     Past Medical History  Diagnosis Date  . Hyperlipidemia    Past Surgical History  Procedure Laterality Date  . Transthoracic echocardiogram  11/12/2010    EF=>55%, moderate calcification of aortic valve leaflets, no MVP seen, normal LV thickness   . Nm myocar perf wall motion  11/03/2009    protocol: Persantine, no evidence of ischemia/infarct, post EF67%  . Cardiac catheterization  02/22/2008    no significant CAD, 50% narrowing of proximal mid-dom. right Coronary artery. medical therapy along with antiplatlet therapy    Family History  Problem Relation Age of Onset  . Cancer Mother   . Hypertension Sister   . Diabetes Brother   . Hyperlipidemia Brother   . Heart disease Maternal Grandmother   . Diabetes Maternal Grandmother   . Diabetes Maternal Grandfather   . Heart disease Maternal Grandfather   . Tuberculosis Paternal Grandmother   . Stroke Paternal Grandfather   . Diabetes  Brother   . Hyperlipidemia Brother   . Heart disease Brother   . Hyperlipidemia Son    Social History  Substance Use Topics  . Smoking status: Former Smoker -- 1.00 packs/day for 30 years    Types: Cigarettes  . Smokeless tobacco: Never Used  . Alcohol Use: Yes     Comment: 1 glass per week   OB History    No data available     Review of Systems  Constitutional: Negative for fever, diaphoresis, appetite change and fatigue.  HENT: Positive for congestion. Negative for ear pain, postnasal drip and rhinorrhea.   Respiratory: Negative for cough, chest tightness and shortness of breath.   Cardiovascular: Negative for chest pain, palpitations and leg swelling.  Gastrointestinal: Negative for nausea, vomiting, abdominal pain and diarrhea.  Genitourinary: Negative for dysuria, urgency, frequency and flank pain.  Musculoskeletal: Negative for myalgias and back pain.  Skin: Negative for rash.  Neurological: Positive for syncope and headaches. Negative for dizziness, seizures, speech difficulty, weakness and light-headedness.  Hematological: Does not bruise/bleed easily.      Allergies  Sulfa antibiotics  Home Medications   Prior to Admission medications   Medication Sig Start Date End Date Taking? Authorizing Provider  aspirin 81 MG tablet Take 81 mg by mouth daily.   Yes Historical Provider, MD  Biotin 5000 MCG CAPS Take 5 mg by mouth daily.   Yes Historical Provider, MD  BYSTOLIC 5 MG tablet TAKE 1 TABLET  BY MOUTH ONCE DAILY 03/17/15  Yes Troy Sine, MD  Calcium-Magnesium-Zinc [phone] MG TABS Take 1 tablet by mouth daily.   Yes Historical Provider, MD  clonazePAM (KLONOPIN) 0.5 MG tablet Take 0.5 mg by mouth daily.   Yes Historical Provider, MD  estrogens, conjugated, (PREMARIN) 1.25 MG tablet Take 1.25 mg by mouth daily.   Yes Historical Provider, MD  gabapentin (NEURONTIN) 300 MG capsule Take 300 mg by mouth daily as needed. For nerve pain   Yes Historical Provider, MD   meloxicam (MOBIC) 15 MG tablet Take 15 mg by mouth daily.   Yes Historical Provider, MD  Polyethyl Glycol-Propyl Glycol (SYSTANE OP) Place 1 drop into both eyes daily.   Yes Historical Provider, MD  vitamin E 1000 UNIT capsule Take 1,000 Units by mouth daily.   Yes Historical Provider, MD   BP 132/58 mmHg  Pulse 68  Temp(Src) 97.6 F (36.4 C) (Oral)  Resp 13  SpO2 97% Physical Exam  Constitutional: She is oriented to person, place, and time. She appears well-developed and well-nourished. No distress.  HENT:  Head: Normocephalic and atraumatic.  Right Ear: External ear normal.  Left Ear: External ear normal.  Nose: Nose normal.  Mouth/Throat: Oropharynx is clear and moist. No oropharyngeal exudate.  Eyes: EOM are normal. Pupils are equal, round, and reactive to light.  Neck: Normal range of motion. Neck supple.  Cardiovascular: Normal rate, regular rhythm, normal heart sounds and intact distal pulses.   No murmur heard. Pulmonary/Chest: Effort normal. No respiratory distress. She has no wheezes. She has no rales.  Abdominal: Soft. She exhibits no distension. There is no tenderness.  Musculoskeletal: Normal range of motion. She exhibits no edema or tenderness.  Neurological: She is alert and oriented to person, place, and time. She has normal strength. No cranial nerve deficit or sensory deficit. She exhibits normal muscle tone. Coordination normal.  Normal heel to shin on examination  Skin: Skin is warm and dry. No rash noted. She is not diaphoretic.  Vitals reviewed.   ED Course  Procedures (including critical care time) Labs Review Labs Reviewed  COMPREHENSIVE METABOLIC PANEL - Abnormal; Notable for the following:    Chloride 99 (*)    Glucose, Bld 135 (*)    Total Protein 6.2 (*)    Alkaline Phosphatase 33 (*)    All other components within normal limits  URINALYSIS, ROUTINE W REFLEX MICROSCOPIC (NOT AT [facility]) - Abnormal; Notable for the following:    APPearance CLOUDY  (*)    Hgb urine dipstick SMALL (*)    Leukocytes, UA MODERATE (*)    All other components within normal limits  URINE MICROSCOPIC-ADD ON - Abnormal; Notable for the following:    Squamous Epithelial / LPF MANY (*)    Bacteria, UA MANY (*)    All other components within normal limits  URINE CULTURE  CBC WITH DIFFERENTIAL/PLATELET  Randolm Idol, ED    Imaging Review Dg Chest 2 View  04/29/2015  CLINICAL DATA:  Acute onset of syncope, nausea and headache. Initial encounter. EXAM: CHEST  2 VIEW COMPARISON:  Chest radiograph performed 10/21/2009 FINDINGS: The lungs are well-aerated. Mild vascular congestion is noted. There is no evidence of focal opacification, pleural effusion or pneumothorax. The heart is borderline normal in size. No acute osseous abnormalities are seen. IMPRESSION: Mild vascular congestion noted.  Lungs remain grossly clear. Electronically Signed   By: Garald Balding M.D.   On: 04/29/2015 21:57   Ct Head Wo Contrast  04/29/2015  CLINICAL DATA:  Unwitnessed syncopal episode while sitting at table. Abdominal pain. History of aortic valve stenosis, first degree heart block. EXAM: CT HEAD WITHOUT CONTRAST TECHNIQUE: Contiguous axial images were obtained from the base of the skull through the vertex without intravenous contrast. COMPARISON:  None. FINDINGS: The ventricles and sulci are normal for age less than expected within normal range for patient's age and though non-specific suggest sequelae of chronic small vessel ischemic disease. No acute large vascular territory infarcts. No abnormal extra-axial fluid collections. Basal cisterns are patent. Moderate calcific atherosclerosis of the carotid siphons. No skull fracture. The included ocular globes and orbital contents are non-suspicious. Status post bilateral ocular lens implants. The mastoid aircells and included paranasal sinuses are well-aerated. IMPRESSION: Normal noncontrast CT head for age. Electronically Signed   By:  Elon Alas M.D.   On: 04/29/2015 22:35   I have personally reviewed and evaluated these images and lab results as part of my medical decision-making.   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  Normal neurologic examination.  EKG with changes including fascicular block since previous EKG although noted in history to have possibly had this before.  Previous Echo with mild AS.  Previous cards note including order of repeat Echo reviewed.  Labs unremarkable.  Patient without shortness of breath but with pulmonary edema on chest xray.  Patient could not be cleared by [facility] syncope criteria.  Discussed with Dr. Posey Pronto who agreed with admission and patient was admitted with [place] for serial troponins and Echo completion. Final diagnoses:  None    1. Syncope    Harvel Quale, MD 04/30/15 [phone]

## 2015-04-30 NOTE — Discharge Summary (Signed)
Physician Discharge Summary  [name] LKG:[id number] DOB: March 20, 1946 DOA: 04/29/2015  PCP: Chesley Noon, MD  Admit date: 04/29/2015 Discharge date: 04/30/2015  Recommendations for Outpatient Follow-up:  1. Pt will need to follow up with PCP in 2 weeks post discharge Please follow up with Dr. Shelva Majestic in 4 weeks Cardiology office to call to set up event monitor No driving for 6 months until cleared by cardiology or identifiable cause found  Discharge Diagnoses:  Syncope -?related to anticholinergic effect of new antihistamine? -consult cardiology--since echo was unremarkable, they felt the patient was stable to go home -Patient did not have any concerning dysrhythmias on telemetry -Cardiology to set up outpatient event monitor and outpatient follow-up with Dr. 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 Claiborne Billings  -No symptoms of angina  -EKG without concerning ischemic changes  -Continue aspirin Aortic stenosis -01/14/2014 echocardiogram EF 55-60, grade 1 DD, mild AS, MR -repeat echo Anxiety -continue klonopin  Discharge Condition: stable  Disposition: home  Diet:heart healthy Wt Readings from Last 3 Encounters:  04/30/15 75.615 kg (166 lb 11.2 oz)  03/13/15 76.613 kg (168 lb 14.4 oz)  02/27/14 75.07 kg (165 lb 8 oz)    History of present illness:  69 year old female with a history of hyperlipidemia and nonobstructive coronary artery disease presented with an episode of syncope. The patient was sitting at a dinner table when she was noted to slump over. The next thing the patient recalled, she was being held up by her friends while sitting on the chair. There is no loss of bowel or bladder function, nor does she better come in.  Apparently, the patient was noted to turn red and then all over. The patient has not had any chest discomfort, shortness breath, nausea, vomiting, diarrhea. Her only complaint was that of a dull headache prior to the syncopal episode. She has not been started on any new medications although, the patient states that she took some type of over-the-counter antihistamine on the morning of 04/29/2015 due to sinus drainage. D-Dimer was 0.31. The patient was seen by cardiology. Echocardiogram was ordered. Echocardiogram did not show any significant aortic stenosis or wall motion abnormality.  Cardiology cleared the patient for discharge. They will contact the patient to set up an outpatient to monitor as well as for follow-up with Dr. Shelva Majestic.  Consultants: cardiology  Discharge Exam: Filed Vitals:   04/30/15 1117  BP: 115/33  Pulse: 87  Temp: 98.1 F (36.7 C)  Resp: 18   Filed Vitals:   04/30/15 0217 04/30/15 0511 04/30/15 0906 04/30/15 1117  BP: 148/70 128/51 115/43 115/33  Pulse: 58 57 63 87  Temp:  98 F (36.7 C) 97.6 F (36.4 C) 98.1 F (36.7 C)  TempSrc:  Oral Oral Oral  Resp:  18 18 18   Height:      Weight:      SpO2: 98% 97% 98% 96%   General: A&O x 3, NAD, pleasant, cooperative Cardiovascular: RRR, no rub, no gallop, no S3 Respiratory: CTAB, no wheeze, no rhonchi Abdomen:soft, nontender, nondistended, positive bowel sounds Extremities: No edema, No lymphangitis, no petechiae  Discharge Instructions      Discharge Instructions    Diet - low sodium heart healthy    Complete by:  As directed  Diet - low sodium heart healthy    Complete by:  As directed      Discharge instructions    Complete by:  As directed   NO driving for 6 months, until cleared by cardiology     Increase activity slowly    Complete by:  As directed      Increase activity slowly    Complete by:  As directed             Medication List    STOP taking these medications         BYSTOLIC 5 MG tablet  Generic drug:  nebivolol      TAKE these medications        aspirin 81 MG tablet  Take 81 mg by mouth daily.     Biotin 5000 MCG Caps  Take 5 mg by mouth daily.     Calcium-Magnesium-Zinc 167-83-8 MG Tabs  Take 1 tablet by mouth daily.     clonazePAM 0.5 MG tablet  Commonly known as:  KLONOPIN  Take 0.5 mg by mouth daily.     estrogens (conjugated) 1.25 MG tablet  Commonly known as:  PREMARIN  Take 1.25 mg by mouth daily.     gabapentin 300 MG capsule  Commonly known as:  NEURONTIN  Take 300 mg by mouth daily as needed. For nerve pain     meloxicam 15 MG tablet  Commonly known as:  MOBIC  Take 15 mg by mouth daily.     SYSTANE OP  Place 1 drop into both eyes daily.     vitamin E 1000 UNIT capsule  Take 1,000 Units by mouth daily.         The results of significant diagnostics from this hospitalization (including imaging, microbiology, ancillary and laboratory) are listed below for reference.    Significant Diagnostic Studies: Dg Chest 2 View  04/29/2015  CLINICAL DATA:  Acute onset of syncope, nausea and headache. Initial encounter. EXAM: CHEST  2 VIEW COMPARISON:  Chest radiograph performed 10/21/2009 FINDINGS: The lungs are well-aerated. Mild vascular congestion is noted. There is no evidence of focal opacification, pleural effusion or pneumothorax. The heart is borderline normal in size. No acute osseous abnormalities are seen. IMPRESSION: Mild vascular congestion noted.  Lungs remain grossly clear. Electronically Signed   By: Garald Balding M.D.   On: 04/29/2015 21:57   Ct Head Wo Contrast  04/29/2015  CLINICAL DATA:  Unwitnessed syncopal episode while sitting at table. Abdominal pain. History of aortic valve stenosis, first degree heart block. EXAM: CT HEAD WITHOUT CONTRAST TECHNIQUE: Contiguous axial images were obtained from the base of the skull through the vertex without intravenous contrast. COMPARISON:  None. FINDINGS: The ventricles  and sulci are normal for age less than expected within normal range for patient's age and though non-specific suggest sequelae of chronic small vessel ischemic disease. No acute large vascular territory infarcts. No abnormal extra-axial fluid collections. Basal cisterns are patent. Moderate calcific atherosclerosis of the carotid siphons. No skull fracture. The included ocular globes and orbital contents are non-suspicious. Status post bilateral ocular lens implants. The mastoid aircells and included paranasal sinuses are well-aerated. IMPRESSION: Normal noncontrast CT head for age. Electronically Signed   By: Elon Alas M.D.   On: 04/29/2015 22:35     Microbiology: No results found for this or any previous visit (from the past 240 hour(s)).   Labs: Basic Metabolic Panel:  Recent Labs Lab 04/29/15 2218 04/30/15 0313  NA 135 136  K 4.3 3.9  CL 99* 100*  CO2 27 26  GLUCOSE 135* 128*  BUN 12 12  CREATININE 0.83 0.84  CALCIUM 9.1 8.6*   Liver Function Tests:  Recent Labs Lab 04/29/15 2218 04/30/15 0313  AST 27 24  ALT 14 14  ALKPHOS 33* 32*  BILITOT 0.5 0.5  PROT 6.2* 5.6*  ALBUMIN 3.6 3.3*   No results for input(s): LIPASE, AMYLASE in the last 168 hours. No results for input(s): AMMONIA in the last 168 hours. CBC:  Recent Labs Lab 04/29/15 2218 04/30/15 0313  WBC 9.2 8.1  NEUTROABS 5.7  --   HGB 13.7 12.9  HCT 41.5 39.1  MCV 92.6 93.1  PLT 238 232   Cardiac Enzymes:  Recent Labs Lab 04/30/15 0313 04/30/15 0744 04/30/15 1322  TROPONINI <0.03 <0.03 <0.03   BNP: Invalid input(s): POCBNP CBG:  Recent Labs Lab 04/30/15 0637  GLUCAP 113*    Time coordinating discharge:  Greater than 30 minutes  Signed:  Lucille Witts, DO [facility] Pager: [phone] 04/30/2015, 6:59 PM

## 2015-04-30 NOTE — H&P (Signed)
[facility] History and Physical  Patient: [name]  MRN: [id number]  DOB: 1946/03/24  DOS: the patient was seen and examined on 04/30/2015 PCP: Chesley Noon, MD  Referring physician: Dr. Karen Kays Chief Complaint: Passing out episode  HPI: [name] is a 69 y.o. female with Past medical history of dyslipidemia nonobstructive coronary artery disease. Patient presents with complaints of passing out episode. The patient was sitting at the dinner table and also having a conversation and suddenly passed out. There was no dizziness lightheadedness or chest pain. While regaining her consciousness the patient found herself surrounded by family. Patient did not have any complaints of loss of control of bowel or bladder or focal deficit. She did have some severe headache. No blurred vision or speech difficulty and fusion. No fall no trauma no injury reported. A chest pain. No palpitation. Her shortness of breath no fever no chills. No diarrhea no constipation no burning urination. Patient didn't took an over-the-counter antihistamine for her chronic sinusitis oriented in the morning which she has been taking her while as needed. No other recent change in her medications reported.  The patient is coming from home.  At her baseline ambulates without support And is independent for most of her ADL; manages her medication on her own.  Review of Systems: as mentioned in the history of present illness.  A comprehensive review of the other systems is negative.  Past Medical History  Diagnosis Date  . Hyperlipidemia    Past Surgical History  Procedure Laterality Date  . Transthoracic echocardiogram  11/12/2010    EF=>55%, moderate calcification of aortic valve leaflets, no MVP seen, normal LV thickness   . Nm myocar perf wall motion  11/03/2009    protocol: Persantine, no evidence of ischemia/infarct, post EF67%  . Cardiac catheterization  02/22/2008    no significant  CAD, 50% narrowing of proximal mid-dom. right Coronary artery. medical therapy along with antiplatlet therapy    Social History:  reports that she has quit smoking. Her smoking use included Cigarettes. She has a 30 pack-year smoking history. She has never used smokeless tobacco. She reports that she drinks alcohol. Her drug history is not on file.  Allergies  Allergen Reactions  . Sulfa Antibiotics Rash    Family History  Problem Relation Age of Onset  . Cancer Mother   . Hypertension Sister   . Diabetes Brother   . Hyperlipidemia Brother   . Heart disease Maternal Grandmother   . Diabetes Maternal Grandmother   . Diabetes Maternal Grandfather   . Heart disease Maternal Grandfather   . Tuberculosis Paternal Grandmother   . Stroke Paternal Grandfather   . Diabetes Brother   . Hyperlipidemia Brother   . Heart disease Brother   . Hyperlipidemia Son     Prior to Admission medications   Medication Sig Start Date End Date Taking? Authorizing Provider  aspirin 81 MG tablet Take 81 mg by mouth daily.   Yes Historical Provider, MD  Biotin 5000 MCG CAPS Take 5 mg by mouth daily.   Yes Historical Provider, MD  BYSTOLIC 5 MG tablet TAKE 1 TABLET BY MOUTH ONCE DAILY 03/17/15  Yes Troy Sine, MD  Calcium-Magnesium-Zinc 508-770-2100 MG TABS Take 1 tablet by mouth daily.   Yes Historical Provider, MD  clonazePAM (KLONOPIN) 0.5 MG tablet Take 0.5 mg by mouth daily.   Yes Historical Provider, MD  estrogens, conjugated, (PREMARIN) 1.25 MG tablet Take 1.25 mg by mouth daily.   Yes Historical Provider,  MD  gabapentin (NEURONTIN) 300 MG capsule Take 300 mg by mouth daily as needed. For nerve pain   Yes Historical Provider, MD  meloxicam (MOBIC) 15 MG tablet Take 15 mg by mouth daily.   Yes Historical Provider, MD  Polyethyl Glycol-Propyl Glycol (SYSTANE OP) Place 1 drop into both eyes daily.   Yes Historical Provider, MD  vitamin E 1000 UNIT capsule Take 1,000 Units by mouth daily.   Yes Historical  Provider, MD    Physical Exam: Filed Vitals:   04/30/15 0215 04/30/15 0216 04/30/15 0217 04/30/15 0511  BP: 134/51 145/61 148/70 128/51  Pulse: 52 59 58 57  Temp: 98 F (36.7 C)   98 F (36.7 C)  TempSrc: Oral   Oral  Resp:    18  Height:      Weight:      SpO2: 98% 98% 98% 97%    General: Alert, Awake and Oriented to Time, Place and Person. Appear in mild distress Eyes: PERRL ENT: Oral Mucosa clear moist. Neck: no JVD Cardiovascular: S1 and S2 Present, no Murmur, Peripheral Pulses Present Respiratory: Bilateral Air entry equal and Decreased,  Clear to Auscultation, no Crackles, no wheezes Abdomen: Bowel Sound present, Soft and no tenderness Skin: no Rash Extremities: no Pedal edema, no calf tenderness Neurologic: Grossly no focal neuro deficit. Labs on Admission:  CBC:  Recent Labs Lab 04/29/15 2218 04/30/15 0313  WBC 9.2 8.1  NEUTROABS 5.7  --   HGB 13.7 12.9  HCT 41.5 39.1  MCV 92.6 93.1  PLT 238 232    CMP     Component Value Date/Time   NA 136 04/30/2015 0313   K 3.9 04/30/2015 0313   CL 100* 04/30/2015 0313   CO2 26 04/30/2015 0313   GLUCOSE 128* 04/30/2015 0313   BUN 12 04/30/2015 0313   CREATININE 0.84 04/30/2015 0313   CALCIUM 8.6* 04/30/2015 0313   PROT 5.6* 04/30/2015 0313   ALBUMIN 3.3* 04/30/2015 0313   AST 24 04/30/2015 0313   ALT 14 04/30/2015 0313   ALKPHOS 32* 04/30/2015 0313   BILITOT 0.5 04/30/2015 0313   GFRNONAA >60 04/30/2015 0313   GFRAA >60 04/30/2015 0313     Recent Labs Lab 04/30/15 0313  TROPONINI <0.03   BNP (last 3 results) No results for input(s): BNP in the last 8760 hours.  ProBNP (last 3 results) No results for input(s): PROBNP in the last 8760 hours.   Radiological Exams on Admission: Dg Chest 2 View  04/29/2015  CLINICAL DATA:  Acute onset of syncope, nausea and headache. Initial encounter. EXAM: CHEST  2 VIEW COMPARISON:  Chest radiograph performed 10/21/2009 FINDINGS: The lungs are well-aerated. Mild  vascular congestion is noted. There is no evidence of focal opacification, pleural effusion or pneumothorax. The heart is borderline normal in size. No acute osseous abnormalities are seen. IMPRESSION: Mild vascular congestion noted.  Lungs remain grossly clear. Electronically Signed   By: Garald Balding M.D.   On: 04/29/2015 21:57   Ct Head Wo Contrast  04/29/2015  CLINICAL DATA:  Unwitnessed syncopal episode while sitting at table. Abdominal pain. History of aortic valve stenosis, first degree heart block. EXAM: CT HEAD WITHOUT CONTRAST TECHNIQUE: Contiguous axial images were obtained from the base of the skull through the vertex without intravenous contrast. COMPARISON:  None. FINDINGS: The ventricles and sulci are normal for age less than expected within normal range for patient's age and though non-specific suggest sequelae of chronic small vessel ischemic disease. No acute large vascular  territory infarcts. No abnormal extra-axial fluid collections. Basal cisterns are patent. Moderate calcific atherosclerosis of the carotid siphons. No skull fracture. The included ocular globes and orbital contents are non-suspicious. Status post bilateral ocular lens implants. The mastoid aircells and included paranasal sinuses are well-aerated. IMPRESSION: Normal noncontrast CT head for age. Electronically Signed   By: Elon Alas M.D.   On: 04/29/2015 22:35   EKG: Independently reviewed. normal sinus rhythm, nonspecific ST and T waves changes, RBBB.  Assessment/Plan 1. Syncope Patient presents with complaints of syncope. Initial EKG CT scan of the head as well as the neurological examination does not reveal any acute abnormality. Chest x-ray is also clear other than mild congestion. Patient does not appear to be volume ordered on examination. Patient also does not appear to be at risk for PE. We'll continue close monitoring on telemetry accordingly gram in the morning serial troponin. We'll check  orthostatic vitals as well. Currently holding off on beta blocker.  2. CAD (coronary artery disease) Essential hypertension   RBBB Holding off on patient's beta blocker at present.  3  Aortic valve stenosis Echocardiogram in the morning.  4  Anxiety state Continuing benzodiazepine.  5  Sinusitis, chronic Unclear whether patient's syncopal event today could also represent sensitivity/side effect of her antihistamine.  Nutrition: Cardiac diet DVT Prophylaxis: subcutaneous Heparin  Advance goals of care discussion: Full code  Family Communication: family was present at bedside, opportunity was given to ask question and all questions were answered satisfactorily at the time of interview. Disposition: Admitted as observation, telemetry unit.  Author: Berle Mull, MD [facility] Pager: [phone] 04/30/2015  If 7PM-7AM, please contact night-coverage [email or web address] Password [email or web address]

## 2015-04-30 NOTE — Progress Notes (Signed)
Received pt report from [place].

## 2015-04-30 NOTE — Progress Notes (Signed)
Pt has orders to be discharged. Discharge instructions given and pt has no additional questions at this time. Medication regimen reviewed and pt educated. Pt educated that she is not to drive for 6 months or until cleared by cardiology. Pt verbalized understanding and has no additional questions. Telemetry box removed. IV removed and site in good condition. Pt stable and wishes to walk of unit with husband.  Maurene Capes RN

## 2015-04-30 NOTE — Progress Notes (Signed)
PROGRESS NOTE  [name] ZCH:[id number] DOB: [birth year] DOA: 04/29/2015 PCP: Chesley Noon, MD Brief History 69 year old female with a history of hyperlipidemia and nonobstructive coronary artery disease presented with an episode of syncope. The patient was sitting at a dinner table when she was noted to slump over. The next thing the patient recalled, she was being held up by her friends while sitting on the chair. There is no loss of bowel or bladder function, nor does she better come in. Apparently, the patient was noted to turn red and then all over. The patient has not had any chest discomfort, shortness breath, nausea, vomiting, diarrhea. Her only complaint was that of a dull headache prior to the syncopal episode. He has not been started on any new medications although, the patient states that she took some type of over-the-counter antihistamine on the morning of 04/29/2015 due to sinus drainage.  D-Dimer was 0.31 Assessment/Plan: Syncope -?related to anticholinergic effect of new antihistamine? 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 Claiborne Billings  -No symptoms of angina  -EKG without concerning ischemic changes  -Continue aspirin and Bystolic  Aortic stenosis -01/14/2014 echocardiogram EF 55-60, grade 1 DD, mild AS, MR -repeat echo Anxiety -continue klonopin  Family Communication:   Pt at beside Disposition Plan:   Home 10/27 if stable      Procedures/Studies: Dg Chest 2 View  04/29/2015  CLINICAL DATA:  Acute onset of syncope, nausea and headache. Initial encounter. EXAM: CHEST  2 VIEW COMPARISON:  Chest radiograph performed 10/21/2009 FINDINGS: The lungs are well-aerated. Mild vascular congestion is noted. There is no evidence of focal opacification, pleural effusion or pneumothorax. The heart is borderline normal in size. No acute  osseous abnormalities are seen. IMPRESSION: Mild vascular congestion noted.  Lungs remain grossly clear. Electronically Signed   By: Garald Balding M.D.   On: 04/29/2015 21:57   Ct Head Wo Contrast  04/29/2015  CLINICAL DATA:  Unwitnessed syncopal episode while sitting at table. Abdominal pain. History of aortic valve stenosis, first degree heart block. EXAM: CT HEAD WITHOUT CONTRAST TECHNIQUE: Contiguous axial images were obtained from the base of the skull through the vertex without intravenous contrast. COMPARISON:  None. FINDINGS: The ventricles and sulci are normal for age less than expected within normal range for patient's age and though non-specific suggest sequelae of chronic small vessel ischemic disease. No acute large vascular territory infarcts. No abnormal extra-axial fluid collections. Basal cisterns are patent. Moderate calcific atherosclerosis of the carotid siphons. No skull fracture. The included ocular globes and orbital contents are non-suspicious. Status post bilateral ocular lens implants. The mastoid aircells and included paranasal sinuses are well-aerated. IMPRESSION: Normal noncontrast CT head for age. Electronically Signed   By: Elon Alas M.D.   On: 04/29/2015 22:35         Subjective: Patient denies fevers, chills, headache, chest pain, dyspnea, nausea, vomiting, diarrhea, abdominal pain, dysuria, hematuria   Objective: Filed Vitals:   04/30/15 0215 04/30/15 0216 04/30/15 0217 04/30/15 0511  BP: 134/51 145/61 148/70 128/51  Pulse: 52 59 58 57  Temp: 98 F (36.7 C)   98 F (36.7 C)  TempSrc: Oral   Oral  Resp:    18  Height:      Weight:      SpO2: 98% 98% 98% 97%    Intake/Output Summary (Last  24 hours) at 04/30/15 0828 Last data filed at 04/30/15 0600  Gross per 24 hour  Intake  742.5 ml  Output    900 ml  Net -157.5 ml   Weight change:  Exam:   General:  Pt is alert, follows commands appropriately, not in acute distress  HEENT: No  icterus, No thrush, No neck mass, [place]/AT  Cardiovascular: RRR, S1/S2, no rubs, no gallops  Respiratory: CTA bilaterally, no wheezing, no crackles, no rhonchi  Abdomen: Soft/+BS, non tender, non distended, no guarding  Extremities: No edema, No lymphangitis, No petechiae, No rashes, no synovitis  Data Reviewed: Basic Metabolic Panel:  Recent Labs Lab 04/29/15 2218 04/30/15 0313  NA 135 136  K 4.3 3.9  CL 99* 100*  CO2 27 26  GLUCOSE 135* 128*  BUN 12 12  CREATININE 0.83 0.84  CALCIUM 9.1 8.6*   Liver Function Tests:  Recent Labs Lab 04/29/15 2218 04/30/15 0313  AST 27 24  ALT 14 14  ALKPHOS 33* 32*  BILITOT 0.5 0.5  PROT 6.2* 5.6*  ALBUMIN 3.6 3.3*   No results for input(s): LIPASE, AMYLASE in the last 168 hours. No results for input(s): AMMONIA in the last 168 hours. CBC:  Recent Labs Lab 04/29/15 2218 04/30/15 0313  WBC 9.2 8.1  NEUTROABS 5.7  --   HGB 13.7 12.9  HCT 41.5 39.1  MCV 92.6 93.1  PLT 238 232   Cardiac Enzymes:  Recent Labs Lab 04/30/15 0313  TROPONINI <0.03   BNP: Invalid input(s): POCBNP CBG:  Recent Labs Lab 04/30/15 0637  GLUCAP 113*    No results found for this or any previous visit (from the past 240 hour(s)).   Scheduled Meds: . aspirin EC  81 mg Oral Daily  . enoxaparin (LOVENOX) injection  40 mg Subcutaneous Q24H  . meloxicam  15 mg Oral Daily  . sodium chloride  3 mL Intravenous Q12H   Continuous Infusions: . sodium chloride 75 mL/hr at 04/30/15 0310     Javarie Crisp, DO  [facility] Pager [phone]  If 7PM-7AM, please contact night-coverage [email or web address] Password [email or web address] 04/30/2015, 8:28 AM

## 2015-04-30 NOTE — ED Notes (Signed)
Patel MD at bedside.

## 2015-04-30 NOTE — Progress Notes (Signed)
Charmane Protzman Shakir [id number] Admission Data: 04/30/2015 2:30 AM Attending Provider: Lavina Hamman, MD [id number] C, MD Code Status: Full  LELIANA KONTZ is a 69 y.o. female patient admitted from ED:  -No acute distress noted.  -No complaints of shortness of breath.  -No complaints of chest pain.   Cardiac Monitoring: Box # 03 in place. Cardiac monitor yields:normal sinus rhythm.  Blood pressure 134/51, pulse 52, temperature 98 F (36.7 C), temperature source Oral, resp. rate 13, height 5\' 5"  (1.651 m), weight 75.615 kg (166 lb 11.2 oz), SpO2 98 %.   IV Fluids:  IV in place, occlusive dsg intact without redness, IV cath antecubital right, condition patent and no redness none.   Allergies:  Sulfa antibiotics  Past Medical History:   has a past medical history of Hyperlipidemia.  Past Surgical History:   has past surgical history that includes transthoracic echocardiogram (11/12/2010); NM MYOCAR PERF WALL MOTION (11/03/2009); and Cardiac catheterization (02/22/2008).  Social History:   reports that she has quit smoking. Her smoking use included Cigarettes. She has a 30 pack-year smoking history. She has never used smokeless tobacco. She reports that she drinks alcohol.  Skin: NSI  Patient/Family orientated to room. Information packet given to patient/family. Admission inpatient armband information verified with patient/family to include name and date of birth and placed on patient arm. Side rails up x 2, fall assessment and education completed with patient/family. Patient/family able to verbalize understanding of risk associated with falls and verbalized understanding to call for assistance before getting out of bed. Call light within reach. Patient/family able to voice and demonstrate understanding of unit orientation instructions.

## 2015-05-01 ENCOUNTER — Ambulatory Visit: Payer: Medicare Other

## 2015-05-01 DIAGNOSIS — R55 Syncope and collapse: Secondary | ICD-10-CM

## 2015-05-01 LAB — URINE CULTURE

## 2015-05-06 ENCOUNTER — Ambulatory Visit (INDEPENDENT_AMBULATORY_CARE_PROVIDER_SITE_OTHER): Payer: Medicare Other | Admitting: *Deleted

## 2015-05-06 VITALS — BP 118/65 | HR 60 | Wt 170.0 lb

## 2015-05-06 DIAGNOSIS — R55 Syncope and collapse: Secondary | ICD-10-CM | POA: Diagnosis not present

## 2015-05-06 NOTE — Progress Notes (Signed)
Pt here for BP check and EKG after recent ED visit for syncope.  States she had dizziness upon waking this AM but it has improved.  No more episodes of passing out.  Is being treated for a sinus infection. Wearing event monitor.  Vital signs and EKG reviewed with Dr. Acie Fredrickson.  Pt to continue same treatment.  Pt aware of appt with Cecilie Kicks, NP on November 17,2016.

## 2015-05-21 ENCOUNTER — Telehealth: Payer: Self-pay | Admitting: *Deleted

## 2015-05-21 ENCOUNTER — Encounter: Payer: Self-pay | Admitting: *Deleted

## 2015-05-21 ENCOUNTER — Ambulatory Visit (INDEPENDENT_AMBULATORY_CARE_PROVIDER_SITE_OTHER): Payer: Medicare Other | Admitting: Cardiology

## 2015-05-21 ENCOUNTER — Encounter: Payer: Self-pay | Admitting: Cardiology

## 2015-05-21 VITALS — BP 140/70 | HR 67 | Ht 60.0 in | Wt 171.0 lb

## 2015-05-21 DIAGNOSIS — I442 Atrioventricular block, complete: Secondary | ICD-10-CM

## 2015-05-21 LAB — CBC WITH DIFFERENTIAL/PLATELET
BASOS ABS: 0.1 10*3/uL (ref 0.0–0.1)
BASOS PCT: 1 % (ref 0–1)
EOS ABS: 0.1 10*3/uL (ref 0.0–0.7)
Eosinophils Relative: 1 % (ref 0–5)
HCT: 40.7 % (ref 36.0–46.0)
HEMOGLOBIN: 13.8 g/dL (ref 12.0–15.0)
LYMPHS ABS: 2.7 10*3/uL (ref 0.7–4.0)
Lymphocytes Relative: 31 % (ref 12–46)
MCH: 30.7 pg (ref 26.0–34.0)
MCHC: 33.9 g/dL (ref 30.0–36.0)
MCV: 90.4 fL (ref 78.0–100.0)
MONOS PCT: 9 % (ref 3–12)
MPV: 11.8 fL (ref 8.6–12.4)
Monocytes Absolute: 0.8 10*3/uL (ref 0.1–1.0)
NEUTROS ABS: 5 10*3/uL (ref 1.7–7.7)
NEUTROS PCT: 58 % (ref 43–77)
PLATELETS: 219 10*3/uL (ref 150–400)
RBC: 4.5 MIL/uL (ref 3.87–5.11)
RDW: 13.7 % (ref 11.5–15.5)
WBC: 8.7 10*3/uL (ref 4.0–10.5)

## 2015-05-21 LAB — BASIC METABOLIC PANEL
BUN: 14 mg/dL (ref 7–25)
CHLORIDE: 102 mmol/L (ref 98–110)
CO2: 28 mmol/L (ref 20–31)
CREATININE: 0.65 mg/dL (ref 0.50–0.99)
Calcium: 9.5 mg/dL (ref 8.6–10.4)
Glucose, Bld: 101 mg/dL — ABNORMAL HIGH (ref 65–99)
Potassium: 4.3 mmol/L (ref 3.5–5.3)
SODIUM: 139 mmol/L (ref 135–146)

## 2015-05-21 NOTE — Progress Notes (Signed)
Electrophysiology Office Note   Date:  05/21/2015   ID:  [name], DOB 07/14/1945, MRN [id number]  PCP:  Chesley Noon, MD  Cardiologist:  Dorris Carnes Primary Electrophysiologist: Will Meredith Leeds, MD    Chief Complaint  Patient presents with  . Follow-up    MONITOR     History of Present Illness: [name] is a 69 y.o. female who presents today for electrophysiology evaluation.   [name] has coronary obstructive disease by cardiac catheterization in 2009 with a 50% RCA stenosis and 20% stenosis in the proximal left circumflex coronary artery. In the past, she has a history of atrial arrhythmia, treated initially with metoprolol, but due to significant fatigability this was switched to Bystolic, for which he has tolerated well. In 2011 a nuclear perfusion study showed normal perfusion. An echo Doppler study on 01/14/2014 showed normal systolic function with mild grade 1 diastolic dysfunction. There was evidence for mild aortic valve stenosis with trivial aortic insufficiency with a mean transvalvular aortic gradient of 10 mm and peak gradient of 20 mm. The left atrium was mildly dilated. She had mild mitral annular calcification with mild MR. She had been started on low-dose simvastatin in the past, but apparently was only on the short-term and stopped taking it. She underwent eye surgery to repair her droopy eyelids 03/2015. The operative CBC and chemistry profile were normal. She tolerated her surgery well. A carotid doppler scan 01/2014 revealed mild bilateral carotid plaque without significant stenosis.  She presented to the ER 04/29/15 with syncope.  She was sitting at a table at a meeting when she remembers her friends calling her name.  She had a headache after the episode.  No dizziness or sensation something was wrong.    Today, she had an episode of dizziness around 7:30 AM.  She was called by the monitor company which showed  evidence of complete heart block at the time of dizziness.   Past Medical History  Diagnosis Date  . Hyperlipidemia    Past Surgical History  Procedure Laterality Date  . Transthoracic echocardiogram  11/12/2010    EF=>55%, moderate calcification of aortic valve leaflets, no MVP seen, normal LV thickness   . Nm myocar perf wall motion  11/03/2009    protocol: Persantine, no evidence of ischemia/infarct, post EF67%  . Cardiac catheterization  02/22/2008    no significant CAD, 50% narrowing of proximal mid-dom. right Coronary artery. medical therapy along with antiplatlet therapy      Current Outpatient Prescriptions  Medication Sig Dispense Refill  . aspirin 81 MG tablet Take 81 mg by mouth daily.    . Biotin 5000 MCG CAPS Take 5 mg by mouth daily.    . Calcium-Magnesium-Zinc 167-83-8 MG TABS Take 1 tablet by mouth daily.    . clonazePAM (KLONOPIN) 0.5 MG tablet Take 0.5 mg by mouth daily.    Marland Kitchen estrogens, conjugated, (PREMARIN) 1.25 MG tablet Take 1.25 mg by mouth daily.    . meloxicam (MOBIC) 15 MG tablet Take 15 mg by mouth daily.    Vladimir Faster Glycol-Propyl Glycol (SYSTANE OP) Place 1 drop into both eyes daily.    . Vitamin D, Cholecalciferol, 400 UNITS CAPS Take 400 Units by mouth daily.    . vitamin E 1000 UNIT capsule Take 1,000 Units by mouth daily.     No current facility-administered medications for this visit.    Allergies:   Sulfa antibiotics   Social History:  The patient  reports  that she has quit smoking. Her smoking use included Cigarettes. She has a 30 pack-year smoking history. She has never used smokeless tobacco. She reports that she drinks alcohol.   Family History:  The patient's family history includes Cancer in her mother; Diabetes in her brother, brother, maternal grandfather, and maternal grandmother; Heart disease in her brother, maternal grandfather, and maternal grandmother; Hyperlipidemia in her brother, brother, and son; Hypertension in her sister;  Stroke in her paternal grandfather; Tuberculosis in her paternal grandmother.    ROS:  Please see the history of present illness.   All other systems are reviewed and negative.    PHYSICAL EXAM: VS:  BP 140/70 mmHg  Pulse 67  Ht 5' (1.524 m)  Wt 171 lb (77.565 kg)  BMI 33.40 kg/m2  SpO2 98% , BMI Body mass index is 33.4 kg/(m^2). GEN: Well nourished, well developed, in no acute distress HEENT: normal Neck: no JVD, carotid bruits, or masses Cardiac: RRR; no murmurs, rubs, or gallops,no edema  Respiratory:  clear to auscultation bilaterally, normal work of breathing GI: soft, nontender, nondistended, + BS Diana: no deformity or atrophy Skin: warm and dry Neuro:  Strength and sensation are intact Psych: euthymic mood, full affect  EKG:  EKG is ordered today. The ekg ordered today shows sinus rhythm  Recent Labs: 04/30/2015: ALT 14; BUN 12; Creatinine, Ser 0.84; Hemoglobin 12.9; Platelets 232; Potassium 3.9; Sodium 136    Lipid Panel     Component Value Date/Time   CHOL 183 01/14/2014 0928   TRIG 118 01/14/2014 0928   HDL 65 01/14/2014 0928   CHOLHDL 2.8 01/14/2014 0928   VLDL 24 01/14/2014 0928   LDLCALC 94 01/14/2014 0928     Wt Readings from Last 3 Encounters:  05/21/15 171 lb (77.565 kg)  05/06/15 170 lb (77.111 kg)  04/30/15 166 lb 11.2 oz (75.615 kg)      Other studies Reviewed: Additional studies/ records that were reviewed today include: TTE 04/30/15  Review of the above records today demonstrates:  - Left ventricle: The cavity size was normal. Systolic function was normal. The estimated ejection fraction was in the range of 60% to 65%. Wall motion was normal; there were no regional wall motion abnormalities. Doppler parameters are consistent with abnormal left ventricular relaxation (grade 1 diastolic dysfunction). - Aortic valve: Cusp separation was reduced. Transvalvular velocity was minimally increased. There was no stenosis. Peak  velocity (S): 224 cm/s. Mean gradient (S): 9 mm Hg. - Mitral valve: Calcified annulus. There was mild regurgitation. - Left atrium: The atrium was mildly dilated.   ASSESSMENT AND PLAN:  1.  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  Patient with symptoms of dizziness during the episode.  Discussed optoins with the patient and has agreed on pacemaker placement.  Discussed risks of the procedure including bleeding, infection, pneumothorax and tamponade.  Will plan on pacemaker placement in 2 days.  Instructed the patient not to drive.  Current medicines are reviewed at length with the patient today.   The patient does not have concerns regarding her medicines.  The following changes were made today:  none  Labs/ tests ordered today include:  Orders Placed This Encounter  Procedures  . Basic metabolic panel  . CBC w/Diff     Disposition:   FU with Will Camnitz post pacemaker  Signed, Will Meredith Leeds, MD  05/21/2015 8:42 PM     [place] [address] [phone] (office) [phone] (fax)

## 2015-05-21 NOTE — Patient Instructions (Signed)
Medication Instructions:  Your physician recommends that you continue on your current medications as directed. Please refer to the Current Medication list given to you today.  Labwork: Pre procedure labs today: BMET, CBC w/ Diff  Testing/Procedures: Your physician has recommended that you have a pacemaker inserted. A pacemaker is a small device that is placed under the skin of your chest or abdomen to help control abnormal heart rhythms. This device uses electrical pulses to prompt the heart to beat at a normal rate. Pacemakers are used to treat heart rhythms that are too slow. Wire (leads) are attached to the pacemaker that goes into the chambers of you heart. This is done in the hospital and usually requires and overnight stay. Please see the instruction sheet given to you today for more information.  Follow-Up: Your physician recommends that you schedule a follow-up appointment in: wound clinic 10-14 days after your procedure on 05/23/15.  Your physician recommends that you schedule a follow-up appointment in: 3 months, with Dr. Curt Bears, after your procedure on 05/23/15.    If you need a refill on your cardiac medications before your next appointment, please call your pharmacy.  Thank you for choosing [facility]!!   Trinidad Curet, RN [phone]    Pacemaker Implantation The heart has its own electrical system, or natural pacemaker, to regulate the heartbeat. Sometimes, the natural pacemaker system of the heart fails and causes the heart to beat too slowly. If this happens, a pacemaker can be surgically placed to help the heart beat at a normal or programmed rate. A pacemaker is a small, battery-powered device that is placed under the skin and is programmed to sense your heartbeats. If your heart rate is lower than the programmed rate, the pacemaker will pace your heart. Parts of a pacemaker include:  Wires or leads. The leads are placed in the heart and transmit electricity to  the heart. The leads are connected to the pulse generator.  Pulse generator. The pulse generator contains a computer and a memory system. The pulse generator also produces the electrical signal that triggers the heart to beat. A pacemaker may be placed if:  You have a slow heartbeat (bradycardia).  You have fainting (syncope).  Shortness of breath (dyspnea) due to heart problems. LET [facility] CARE PROVIDER KNOW ABOUT:  Any allergies you may have.  All medicines you are taking, including vitamins, herbs, eye drops, creams, and over-the-counter medicines.  Previous problems you or members of your family have had with the use of anesthetics.  Any blood disorders you have.  Previous surgeries you have had.  Medical conditions you have.  Possibility of pregnancy, if this applies. RISKS AND COMPLICATIONS Generally, pacemaker implantation is a safe procedure. However, problems can occur and include:  Bleeding.  Unable to place the pacemaker under local sedation.  Infection. BEFORE THE PROCEDURE  You will have blood work drawn before the procedure.  Do not use any tobacco products including cigarettes, chewing tobacco, or electronic cigarettes. If you need help quitting, ask your health care provider.  Do not eat or drink anything after midnight on the night before the procedure or as directed by your health care provider.  Ask your health care provider about:  Changing or stopping your regular medicines. This is especially important if you are taking diabetes medicines or blood thinners.  Taking medicines such as aspirin and ibuprofen. These medicines can thin your blood. Do not take these medicines before your procedure if your health care provider  asks you not to.  Ask your health care provider if you can take a sip of water with any approved medicines the morning of the procedure. PROCEDURE  The surgery to place a pacemaker is considered a minimally invasive surgical  procedure. It is done under a local anesthetic, which is an injection at the incision site that makes the skin numb. You are also given sedation and pain medicine that makes you drowsy during the procedure.   An intravenous line (IV) will be started in your hand or arm so sedation and pain medicine can be given during the pacemaker procedure.  A numbing medicine will be injected into the skin where the pacemaker is to be placed. A small incision will then be made into the skin. The pacemaker is usually placed under the skin near the collarbone.  After the incision has been made, the leads will be inserted into a large vein and guided into the heart using X-ray.  Using the same incision that was used to place the leads, a small pocket will be created under the skin to hold the pulse generator. The leads will then be connected to the pulse generator.  The incision site will then be closed. A bandage (dressing) is placed over the pacemaker site. The dressing is removed 24-48 hours afterward. AFTER THE PROCEDURE  You will be taken to a recovery area after the pacemaker implant. Your vital signs such as blood pressure, heart rate, breathing, and oxygen levels will be monitored.  A chest X-ray will be done after the pacemaker has been implanted. This is to make sure the pacemaker and leads are in the correct place.   This information is not intended to replace advice given to you by your health care provider. Make sure you discuss any questions you have with your health care provider.   Document Released: 06/11/2002 Document Revised: 07/12/2014 Document Reviewed: 10/26/2011 Elsevier Interactive Patient Education 2016 [employer]   Pacemaker Implantation, Care After Refer to this sheet over the next few weeks. These instructions provide you with information on caring for yourself after the procedure. Your health care provider may also give you more specific instructions. Your treatment has been  planned according to current medical practices, but problems sometimes occur. Call your health care provider if you have any problems or questions regarding your pacemaker.  WHAT TO EXPECT AFTER THE PROCEDURE  You may feel pain. Some pain is normal. It may last a few days.  A slight bump may be seen over the skin where the device was placed. Sometimes, it is possible to feel the device under the skin. This is normal.  In the months and years afterward, your health care provider will check the device, the leads, and the battery every few months. Eventually, when the battery is low, the device will be replaced. HOME CARE INSTRUCTIONS Medicines  Take medicines only as directed by your health care provider.  If you were prescribed an antibiotic medicine, finish it all even if you start to feel better.  Do not take any other medicines without asking your health care provider first. Some medicines, including certain painkillers, can cause bleeding in your stomach after surgery. Wound Care  Do not remove the bandage on your chest until directed to do so by your health care provider.  After your bandage is removed, you may see pieces of tape called skin adhesive strips over the area where the cut was made (incision site). Let them fall off on their  own.  Check the incision site every day to make sure it is not infected, bleeding, or starting to pull apart.  Do not use lotions or ointments near the incision site unless directed to do so.  Keep the incision area clean and dry for 2-3 days after the procedure or as directed by your health care provider. It takes several weeks for the incision site to completely heal.  Do not take baths, swim, or use a hot tub until your health care provider approves. Activities  Try to walk a little every day. Exercising is important after this procedure. It is also important to use your shoulder on the side of the pacemaker in daily tasks that do not require  exaggerated motion.  Avoid sudden jerking, pulling, or chopping movements that pull your upper arm far away from your body for at least 6 weeks.  Do not lift your upper arm above your shoulders for at least 6 weeks. This means no tennis, golf, or swimming for this period of time. If you sleep with the arm above your head, use a restraint to prevent this from happening as you sleep.  You may go back to work when your health care provider says it is okay. Check with your health care provider before you start to drive or play sports. Other Instructions  Follow diet instructions if they were provided. You should be able to eat what you usually do right away, but you may need to limit your salt intake.  Weigh yourself every day. If you suddenly gain weight, fluid may be building up in your body.  Always carry your pacemaker identification card with you. The card should list the implant date, device model, and manufacturer. Consider wearing a medical alert bracelet or necklace.  Tell all health care providers that you have a pacemaker. This may prevent them from giving you a magnetic resource imaging scan (MRI) because of the strong magnets used during that test.  If you must pass through a metal detector, quickly walk through it. Do not stop under the detector or stand near it.  Avoid places or objects with a strong electric or magnetic field, including:  Engineer, maintenance. When at the airport, let officials know you have a pacemaker. Your ID card will let you be checked in a way that is safe for you and that will not damage your pacemaker. Also, do not let a security person wave a magnetic wand near your pacemaker. That can make it stop working.  Power plants.  Large electrical generators.  Radiofrequency transmission towers, such as cell phone and radio towers.  Do not use amateur (ham) radio equipment or electric (arc) welding torches. Some devices are safe to use if held at least 1  foot from your pacemaker. These include power tools, lawn mowers, and speakers. If you are unsure of whether something is safe to use, ask your health care provider.  You may safely use electric blankets, heating pads, computers, and microwave ovens.  When using your cell phone, hold it to the ear opposite the pacemaker. Do not leave your cell phone in a pocket over the pacemaker.  Keep all follow-up visits as directed by your health care provider. This is how your health care provider makes sure your chest is healing the way it should. Ask your health care provider when you should come back to have your stitches or staples taken out.  Have your pacemaker checked every 3-6 months or as directed by your  health care provider. Most pacemakers last for 4-8 years before a new one is needed. SEEK MEDICAL CARE IF:  You gain weight suddenly.  Your legs or feet swell more than they have before.  It feels like your heart is fluttering or skipping beats (heart palpitations).  You have a fever. SEEK IMMEDIATE MEDICAL CARE IF:  You have chest pain.  You feel more short of breath than you have felt before.  You feel more light-headed than you have felt before.  You have problems with your incision site, such as swelling or bleeding, or it starts to open up.  You have drainage, redness, swelling, or pain at your incision site.   This information is not intended to replace advice given to you by your health care provider. Make sure you discuss any questions you have with your health care provider.   Document Released: 01/08/2005 Document Revised: 07/12/2014 Document Reviewed: 10/22/2011 Elsevier Interactive Patient Education [employer].

## 2015-05-21 NOTE — Telephone Encounter (Signed)
Received monitor report of a 5.2 sec ventricular standstill at 08:36 this am.  Called patient, she stated that she had some dizziness at this time.  She said that she woke up with dizziness but that it is some better now.  No sob or cp.  She says that she is recovering from a sinus infection which she deemed responsible for this.  Reviewed with flex, she recommended that she come in today to see Dr. Curt Bears.  Appt made for 3:30pm.  Patient notified and advised not to drive.  Patient in agreement, will be in this afternoon.

## 2015-05-22 ENCOUNTER — Ambulatory Visit: Payer: Medicare Other | Admitting: Cardiology

## 2015-05-23 ENCOUNTER — Encounter (HOSPITAL_COMMUNITY): Payer: Self-pay | Admitting: *Deleted

## 2015-05-23 ENCOUNTER — Ambulatory Visit (HOSPITAL_COMMUNITY)
Admission: RE | Admit: 2015-05-23 | Discharge: 2015-05-24 | Disposition: A | Discharge status: Home / Self Care | Payer: Medicare Other | Source: Ambulatory Visit | Attending: Cardiology | Admitting: Cardiology

## 2015-05-23 ENCOUNTER — Encounter (HOSPITAL_COMMUNITY)
Admission: RE | Disposition: A | Discharge status: Home / Self Care | Payer: Medicare Other | Source: Ambulatory Visit | Attending: Cardiology

## 2015-05-23 DIAGNOSIS — Z7982 Long term (current) use of aspirin: Secondary | ICD-10-CM | POA: Insufficient documentation

## 2015-05-23 DIAGNOSIS — Z950 Presence of cardiac pacemaker: Secondary | ICD-10-CM | POA: Diagnosis not present

## 2015-05-23 DIAGNOSIS — I452 Bifascicular block: Secondary | ICD-10-CM | POA: Diagnosis not present

## 2015-05-23 DIAGNOSIS — Z23 Encounter for immunization: Secondary | ICD-10-CM | POA: Diagnosis not present

## 2015-05-23 DIAGNOSIS — Z791 Long term (current) use of non-steroidal anti-inflammatories (NSAID): Secondary | ICD-10-CM | POA: Diagnosis not present

## 2015-05-23 DIAGNOSIS — I442 Atrioventricular block, complete: Secondary | ICD-10-CM

## 2015-05-23 DIAGNOSIS — Z79899 Other long term (current) drug therapy: Secondary | ICD-10-CM | POA: Diagnosis not present

## 2015-05-23 DIAGNOSIS — Z87891 Personal history of nicotine dependence: Secondary | ICD-10-CM | POA: Diagnosis not present

## 2015-05-23 DIAGNOSIS — Z95818 Presence of other cardiac implants and grafts: Secondary | ICD-10-CM

## 2015-05-23 DIAGNOSIS — I459 Conduction disorder, unspecified: Secondary | ICD-10-CM | POA: Diagnosis present

## 2015-05-23 DIAGNOSIS — I2510 Atherosclerotic heart disease of native coronary artery without angina pectoris: Secondary | ICD-10-CM | POA: Diagnosis present

## 2015-05-23 DIAGNOSIS — R001 Bradycardia, unspecified: Secondary | ICD-10-CM | POA: Diagnosis present

## 2015-05-23 DIAGNOSIS — Z87898 Personal history of other specified conditions: Secondary | ICD-10-CM

## 2015-05-23 DIAGNOSIS — E785 Hyperlipidemia, unspecified: Secondary | ICD-10-CM | POA: Diagnosis not present

## 2015-05-23 HISTORY — PX: EP IMPLANTABLE DEVICE: SHX172B

## 2015-05-23 HISTORY — DX: Bifascicular block: I45.2

## 2015-05-23 HISTORY — DX: Atrioventricular block, complete: I44.2

## 2015-05-23 HISTORY — DX: Personal history of other specified conditions: Z87.898

## 2015-05-23 LAB — SURGICAL PCR SCREEN
MRSA, PCR: NEGATIVE
Staphylococcus aureus: NEGATIVE

## 2015-05-23 SURGERY — PACEMAKER IMPLANT
Anesthesia: LOCAL

## 2015-05-23 MED ORDER — ONDANSETRON HCL 4 MG/2ML IJ SOLN: 4.0000 mg | Freq: Four times a day (QID) | INTRAMUSCULAR | Status: DC | PRN

## 2015-05-23 MED ORDER — LIDOCAINE-EPINEPHRINE 1 %-1:100000 IJ SOLN
INTRAMUSCULAR | Status: AC
  Filled 2015-05-23: qty 2

## 2015-05-23 MED ORDER — SODIUM CHLORIDE 0.9 % IV SOLN
INTRAVENOUS | Status: DC
  Administered 2015-05-23: 10:00:00 via INTRAVENOUS

## 2015-05-23 MED ORDER — HEPARIN (PORCINE) IN NACL 2-0.9 UNIT/ML-% IJ SOLN
INTRAMUSCULAR | Status: DC | PRN
  Administered 2015-05-23: 14:00:00

## 2015-05-23 MED ORDER — MUPIROCIN 2 % EX OINT
1.0000 "application " | TOPICAL_OINTMENT | Freq: Once | CUTANEOUS | Status: AC
  Administered 2015-05-23: 1 via TOPICAL
  Filled 2015-05-23: qty 22

## 2015-05-23 MED ORDER — CEFAZOLIN SODIUM-DEXTROSE 2-3 GM-% IV SOLR
2.0000 g | INTRAVENOUS | Status: AC
  Administered 2015-05-23: 2 g via INTRAVENOUS

## 2015-05-23 MED ORDER — ACETAMINOPHEN 325 MG PO TABS
325.0000 mg | ORAL_TABLET | ORAL | Status: DC | PRN
  Administered 2015-05-23 – 2015-05-24 (×2): 650 mg via ORAL
  Filled 2015-05-23 (×2): qty 2

## 2015-05-23 MED ORDER — MIDAZOLAM HCL 5 MG/5ML IJ SOLN
INTRAMUSCULAR | Status: AC
  Filled 2015-05-23: qty 5

## 2015-05-23 MED ORDER — YOU HAVE A PACEMAKER BOOK
Freq: Once | Status: AC
  Administered 2015-05-23: 21:00:00
  Filled 2015-05-23: qty 1

## 2015-05-23 MED ORDER — SODIUM CHLORIDE 0.9 % IR SOLN
80.0000 mg | Status: AC
  Administered 2015-05-23: 80 mg
  Filled 2015-05-23: qty 2

## 2015-05-23 MED ORDER — FENTANYL CITRATE (PF) 100 MCG/2ML IJ SOLN
INTRAMUSCULAR | Status: AC
  Filled 2015-05-23: qty 2

## 2015-05-23 MED ORDER — CEFAZOLIN SODIUM-DEXTROSE 2-3 GM-% IV SOLR
INTRAVENOUS | Status: AC
  Filled 2015-05-23: qty 50

## 2015-05-23 MED ORDER — LIDOCAINE HCL (PF) 1 % IJ SOLN
INTRAMUSCULAR | Status: AC
  Filled 2015-05-23: qty 30

## 2015-05-23 MED ORDER — MIDAZOLAM HCL 5 MG/5ML IJ SOLN
INTRAMUSCULAR | Status: DC | PRN
  Administered 2015-05-23 (×5): 1 mg via INTRAVENOUS

## 2015-05-23 MED ORDER — GENTAMICIN SULFATE 40 MG/ML IJ SOLN
INTRAMUSCULAR | Status: AC
  Filled 2015-05-23: qty 2

## 2015-05-23 MED ORDER — LIDOCAINE HCL (PF) 1 % IJ SOLN
INTRAMUSCULAR | Status: DC | PRN
  Administered 2015-05-23: 12 mL via INTRADERMAL

## 2015-05-23 MED ORDER — FENTANYL CITRATE (PF) 100 MCG/2ML IJ SOLN
INTRAMUSCULAR | Status: DC | PRN
  Administered 2015-05-23 (×3): 25 ug via INTRAVENOUS

## 2015-05-23 MED ORDER — HEPARIN (PORCINE) IN NACL 2-0.9 UNIT/ML-% IJ SOLN
INTRAMUSCULAR | Status: AC
  Filled 2015-05-23: qty 500

## 2015-05-23 MED ORDER — CLONAZEPAM 0.5 MG PO TABS
0.5000 mg | ORAL_TABLET | Freq: Every day | ORAL | Status: DC
  Filled 2015-05-23: qty 1

## 2015-05-23 MED ORDER — LIDOCAINE-EPINEPHRINE 1 %-1:100000 IJ SOLN
INTRAMUSCULAR | Status: DC | PRN
  Administered 2015-05-23: 40 mL via INTRADERMAL

## 2015-05-23 MED ORDER — MUPIROCIN 2 % EX OINT
TOPICAL_OINTMENT | CUTANEOUS | Status: AC
  Administered 2015-05-23: 1 via TOPICAL
  Filled 2015-05-23: qty 22

## 2015-05-23 MED ORDER — HEPARIN (PORCINE) IN NACL 2-0.9 UNIT/ML-% IJ SOLN
INTRAMUSCULAR | Status: AC
  Filled 2015-05-23: qty 1000

## 2015-05-23 MED ORDER — INFLUENZA VAC SPLIT QUAD 0.5 ML IM SUSY
0.5000 mL | PREFILLED_SYRINGE | INTRAMUSCULAR | Status: AC
  Administered 2015-05-24: 0.5 mL via INTRAMUSCULAR
  Filled 2015-05-23: qty 0.5

## 2015-05-23 MED ORDER — CEFAZOLIN SODIUM 1-5 GM-% IV SOLN
1.0000 g | Freq: Four times a day (QID) | INTRAVENOUS | Status: AC
  Administered 2015-05-23 – 2015-05-24 (×3): 1 g via INTRAVENOUS
  Filled 2015-05-23 (×3): qty 50

## 2015-05-23 SURGICAL SUPPLY — 7 items
CABLE SURGICAL S-101-97-12 (CABLE) ×2 IMPLANT
LEAD CAPSURE NOVUS 45CM (Lead) ×1 IMPLANT
LEAD CAPSURE NOVUS 5076-52CM (Lead) ×1 IMPLANT
PAD DEFIB LIFELINK (PAD) ×1 IMPLANT
PPM ADVISA MRI DR A2DR01 (Pacemaker) ×1 IMPLANT
SHEATH CLASSIC 7F (SHEATH) ×2 IMPLANT
TRAY PACEMAKER INSERTION (PACKS) ×1 IMPLANT

## 2015-05-23 NOTE — Progress Notes (Signed)
Electrophysiology Office Note   Date:  05/23/2015   ID:  [name], DOB 09/23/1945, MRN [id number]  PCP:  Diana Noon, MD  Cardiologist:  [name] Primary Electrophysiologist: Will Meredith Leeds, MD    No chief complaint on file.    History of Present Illness: [name] is a 69 y.o. female who presents today for electrophysiology evaluation.   Ms [name] has coronary obstructive disease by cardiac catheterization in 2009 with a 50% RCA stenosis and 20% stenosis in the proximal left circumflex coronary artery. In the past, she has a history of atrial arrhythmia, treated initially with metoprolol, but due to significant fatigability this was switched to Bystolic, for which he has tolerated well. In 2011 a nuclear perfusion study showed normal perfusion. An echo Doppler study on 01/14/2014 showed normal systolic function with mild grade 1 diastolic dysfunction. There was evidence for mild aortic valve stenosis with trivial aortic insufficiency with a mean transvalvular aortic gradient of 10 mm and peak gradient of 20 mm. The left atrium was mildly dilated. She had mild mitral annular calcification with mild MR. She presented to the ER 04/29/15 with syncope.  She was sitting at a table at a meeting when she remembers her friends calling her name.  She had a headache after the episode.  No dizziness or sensation something was wrong.    She feels well today, anxious to start the procedure.   Past Medical History  Diagnosis Date  . Hyperlipidemia    Past Surgical History  Procedure Laterality Date  . Transthoracic echocardiogram  11/12/2010    EF=>55%, moderate calcification of aortic valve leaflets, no MVP seen, normal LV thickness   . Nm myocar perf wall motion  11/03/2009    protocol: Persantine, no evidence of ischemia/infarct, post EF67%  . Cardiac catheterization  02/22/2008    no significant CAD, 50% narrowing of proximal mid-dom. right  Coronary artery. medical therapy along with antiplatlet therapy      Current Facility-Administered Medications  Medication Dose Route Frequency Provider Last Rate Last Dose  . 0.9 %  sodium chloride infusion   Intravenous Continuous Will Meredith Leeds, MD 50 mL/hr at 05/23/15 0934    . 0.9 %  sodium chloride infusion   Intravenous Continuous Will Meredith Leeds, MD 50 mL/hr at 05/23/15 0934    . ceFAZolin (ANCEF) IVPB 2 g/50 mL premix  2 g Intravenous On Call Will Meredith Leeds, MD      . gentamicin (GARAMYCIN) 80 mg in sodium chloride irrigation 0.9 % 500 mL irrigation  80 mg Irrigation On Call Will Meredith Leeds, MD        Allergies:   Sulfa antibiotics   Social History:  The patient  reports that she has quit smoking. Her smoking use included Cigarettes. She has a 30 pack-year smoking history. She has never used smokeless tobacco. She reports that she drinks alcohol.   Family History:  The patient's family history includes Cancer in her mother; Diabetes in her brother, brother, maternal grandfather, and maternal grandmother; Heart disease in her brother, maternal grandfather, and maternal grandmother; Hyperlipidemia in her brother, brother, and son; Hypertension in her sister; Stroke in her paternal grandfather; Tuberculosis in her paternal grandmother.    ROS:  Please see the history of present illness.   All other systems are reviewed and negative.    PHYSICAL EXAM: VS:  BP 134/61 mmHg  Pulse 61  Temp(Src) 97.4 F (36.3 C) (Oral)  Resp 18  Ht 5' 5.5" (1.664 m)  Wt 170 lb (77.111 kg)  BMI 27.85 kg/m2  SpO2 100% , BMI Body mass index is 27.85 kg/(m^2). GEN: Well nourished, well developed, in no acute distress HEENT: normal Neck: no JVD, carotid bruits, or masses Cardiac: RRR; no murmurs, rubs, or gallops,no edema  Respiratory:  clear to auscultation bilaterally, normal work of breathing GI: soft, nontender, nondistended, + BS MS: no deformity or atrophy Skin: warm and  dry Neuro:  Strength and sensation are intact Psych: euthymic mood, full affect  EKG:  EKG is ordered today. The ekg ordered today shows sinus rhythm  Recent Labs: 04/30/2015: ALT 14 05/21/2015: BUN 14; Creat 0.65; Hemoglobin 13.8; Platelets 219; Potassium 4.3; Sodium 139    Lipid Panel     Component Value Date/Time   CHOL 183 01/14/2014 0928   TRIG 118 01/14/2014 0928   HDL 65 01/14/2014 0928   CHOLHDL 2.8 01/14/2014 0928   VLDL 24 01/14/2014 0928   LDLCALC 94 01/14/2014 0928     Wt Readings from Last 3 Encounters:  05/23/15 170 lb (77.111 kg)  05/21/15 171 lb (77.565 kg)  05/06/15 170 lb (77.111 kg)      Other studies Reviewed: Additional studies/ records that were reviewed today include: TTE 04/30/15  Review of the above records today demonstrates:  - Left ventricle: The cavity size was normal. Systolic function was normal. The estimated ejection fraction was in the range of 60% to 65%. Wall motion was normal; there were no regional wall motion abnormalities. Doppler parameters are consistent with abnormal left ventricular relaxation (grade 1 diastolic dysfunction). - Aortic valve: Cusp separation was reduced. Transvalvular velocity was minimally increased. There was no stenosis. Peak velocity (S): 224 cm/s. Mean gradient (S): 9 mm Hg. - Mitral valve: Calcified annulus. There was mild regurgitation. - Left atrium: The atrium was mildly dilated.   ASSESSMENT AND PLAN:  1.  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  Dizzy symptoms.  Pacemaker today.  Explained risks and beneftis including bleeding, infection, pneumothorax, and tamponade.  Patient has agreed.  Will implant medtronic device as she gets MRIs.  Current medicines are reviewed at length with the patient today.   The patient does not have concerns regarding her medicines.  The following changes were made today:  none  Labs/ tests ordered today include:  Orders Placed This Encounter   Procedures  . Surgical pcr screen  . Diet NPO time specified Except for: Sips with Meds  . Informed consent details: transcribe and obtain patient signature  . Electrode Placement  . For ICD and ICD generator change patients, confirm EKG has been done in the past 30 days.  If not place order and obtain test.  . If patient on heparin, discontinue at 0300 day of procedure  . If patient on Lovenox, discontinue at midnight the night before procedure  . Use clippers to remove hair, entire chest area  . Verify informed consent  . Void on call to EP Lab  . Lab instructions  . AM of procedure hold 70/30 insulin dose (if ordered)  . AM of procedure hold oral hypoglycemic agents (if ordered)  . If patient on AM basal insulin: AM ON DAY OF PROCEDURE:  Give 1/2 basal dose (Lantus, Levemir, or NPH)  . EP PPM/ICD IMPLANT     Disposition:   FU with Will Camnitz post pacemaker  Signed, Will Meredith Leeds, MD  05/23/2015 1:06 PM     [facility] [place]  [address] [place] [address] [phone] (office) [phone] (fax)

## 2015-05-24 ENCOUNTER — Other Ambulatory Visit: Payer: Self-pay

## 2015-05-24 ENCOUNTER — Encounter (HOSPITAL_COMMUNITY): Payer: Self-pay | Admitting: Cardiology

## 2015-05-24 ENCOUNTER — Ambulatory Visit (HOSPITAL_COMMUNITY): Payer: Medicare Other

## 2015-05-24 DIAGNOSIS — Z23 Encounter for immunization: Secondary | ICD-10-CM | POA: Diagnosis not present

## 2015-05-24 DIAGNOSIS — Z87898 Personal history of other specified conditions: Secondary | ICD-10-CM

## 2015-05-24 DIAGNOSIS — R001 Bradycardia, unspecified: Secondary | ICD-10-CM | POA: Diagnosis present

## 2015-05-24 DIAGNOSIS — I452 Bifascicular block: Secondary | ICD-10-CM | POA: Diagnosis not present

## 2015-05-24 DIAGNOSIS — I2510 Atherosclerotic heart disease of native coronary artery without angina pectoris: Secondary | ICD-10-CM | POA: Diagnosis not present

## 2015-05-24 DIAGNOSIS — Z950 Presence of cardiac pacemaker: Secondary | ICD-10-CM | POA: Diagnosis not present

## 2015-05-24 DIAGNOSIS — I442 Atrioventricular block, complete: Secondary | ICD-10-CM | POA: Diagnosis not present

## 2015-05-24 HISTORY — DX: Personal history of other specified conditions: Z87.898

## 2015-05-24 MED ORDER — ACETAMINOPHEN 325 MG PO TABS: 325.0000 mg | ORAL_TABLET | ORAL | Status: DC | PRN

## 2015-05-24 NOTE — Discharge Instructions (Signed)
° ° °  Supplemental Discharge Instructions for  Pacemaker Patients  Activity No heavy lifting or vigorous activity with your left arm for 6 to 8 weeks.  Do not raise your left arm above your head for one week.  Gradually raise your affected arm as drawn below.           __11/19/16                 05/26/15                    05/28/15                05/30/15  NO DRIVING for 1 week; you may begin driving on [id number].  WOUND CARE - Keep the wound area clean and dry.  Do not get this area wet for one week. No showers for one week; you may shower on 05/29/15. - The tape/steri-strips on your wound will fall off; do not pull them off.  No bandage is needed on the site.  DO  NOT apply any creams, oils, or ointments to the wound area. - If you notice any drainage or discharge from the wound, any swelling or bruising at the site, or you develop a fever > 101? F after you are discharged home, call the office at once.  Special Instructions - You are still able to use cellular telephones; use the ear opposite the side where you have your pacemaker/defibrillator.  Avoid carrying your cellular phone near your device. - When traveling through airports, show security personnel your identification card to avoid being screened in the metal detectors.  Ask the security personnel to use the hand wand. - Avoid arc welding equipment, MRI testing (magnetic resonance imaging), TENS units (transcutaneous nerve stimulators).  Call the office for questions about other devices. - Avoid electrical appliances that are in poor condition or are not properly grounded. - Microwave ovens are safe to be near or to operate.   Heart Healthy diet

## 2015-05-24 NOTE — Discharge Summary (Signed)
Physician Discharge Summary       Patient ID: [name] MRN: [id number] DOB/AGE: 67-Jul-1947 67 y.o.  Admit date: 05/23/2015 Discharge date: 05/24/2015 Primary Cardiologist: Dr. Corene Cornea  EP: Dr. 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 Curt Bears.   Hospital Course: 69 year old female with recent syncope was seen after heart block on event monitor.  She has a hx coronary obstructive disease by cardiac catheterization in 2009 with a 50% RCA stenosis and 20% stenosis in the proximal left circumflex coronary artery. In the past, she has a history of atrial arrhythmia, treated initially with metoprolol, but due to significant fatigability this was switched to Bystolic, for which he has tolerated well. In 2011 a nuclear perfusion study showed normal perfusion. An echo Doppler study on 01/14/2014 showed normal systolic function with mild grade 1 diastolic dysfunction. There was evidence for mild aortic valve stenosis with trivial aortic insufficiency with a mean transvalvular aortic gradient of 10 mm and peak gradient of 20 mm. The left atrium was mildly dilated. She had mild mitral annular calcification with mild MR.  04/29/15 pt with syncope and seen in ER. She was sitting at a table at a meeting when she remembers her friends calling her name. She had a headache after the episode. No dizziness or sensation something was wrong.   On event monitor 05/20/14 she had an episode of dizziness around 7:30 AM. She was called by the monitor company which showed evidence of complete heart block at the time of dizziness. She was seen in the office  by Dr. Curt Bears.  With intermittent CHB PPM was recommended.  Pt agreed.  Pt presented electively and underwent PPM without complications.  The next AM she had no complaints except mild dizziness.  Her pacemaker was interrogated and was WNL.  Her CXR without pneumothorax.  She was seen and evaluated by Dr. Curt Bears and found stable for discharge.  She will follow up for site check in devise clinic and then with Dr. Curt Bears. Instructed on Pace maker care.    Consults: None  Significant Diagnostic Studies:  BMP Latest Ref Rng 05/21/2015 04/30/2015 04/29/2015  Glucose 65 - 99 mg/dL 101(H) 128(H) 135(H)  BUN 7 - 25 mg/dL 14 12 12   Creatinine 0.50 - 0.99 mg/dL 0.65 0.84 0.83  Sodium 135 - 146 mmol/L 139 136 135  Potassium 3.5 - 5.3 mmol/L 4.3 3.9 4.3  Chloride 98 - 110 mmol/L 102 100(L) 99(L)  CO2 20 - 31 mmol/L 28 26 27   Calcium 8.6 - 10.4 mg/dL 9.5 8.6(L) 9.1   CBC Latest Ref Rng 05/21/2015 04/30/2015 04/29/2015  WBC 4.0 - 10.5 K/uL 8.7 8.1 9.2  Hemoglobin 12.0 - 15.0 g/dL 13.8 12.9 13.7  Hematocrit 36.0 - 46.0 % 40.7 39.1 41.5  Platelets 150 - 400 K/uL 219 232 238    CHEST 2 VIEW  COMPARISON: 04/29/2015  FINDINGS: New left anterior chest wall sequential pacemaker has its leads position was in the right atrium and right ventricle.  No pneumothorax.  Lungs are clear. Cardiac silhouette is normal in size and configuration. Aorta is mildly uncoiled. No mediastinal or hilar masses or evidence of adenopathy.  IMPRESSION:  1. Pacemaker leads are well positioned. 2. No pneumothorax. No acute cardiopulmonary disease.   Discharge Exam: Blood pressure 105/46, pulse 67, temperature 97.8 F (36.6 C), temperature source Oral, resp. rate 16, height 5' 5.5" (1.664 m), weight 181 lb 11.2 oz (82.419 kg), SpO2 96 %. General:Pleasant affect, NAD Skin:Warm and dry, brisk capillary refill HEENT:normocephalic, sclera clear, mucus membranes moist Heart:S1S2 RRR without murmur, gallup, rub  or click, pacer site mildly tender, now swelling or drainage. Lungs:clear without rales, rhonchi, or wheezes [id number], non tender, + BS, do not palpate liver spleen or masses Ext:no lower ext edema, 2+ pedal pulses, 2+ radial pulses Neuro:alert and oriented X 3, MAE, follows commands, + facial symmetry   EKG with AV pacing   Disposition: 01-Home or Self Care     Medication List    STOP taking these medications        vitamin E 1000 UNIT capsule      TAKE these medications        acetaminophen 325 MG tablet  Commonly known as:  TYLENOL  Take 1-2 tablets (325-650 mg total) by mouth every 4 (four) hours as needed for mild pain.     aspirin 81 MG EC tablet  Take 81 mg by mouth every other day. Swallow whole.     Biotin 5000 MCG Caps  Take 5 mg by mouth daily.     Calcium-Magnesium-Zinc 167-83-8 MG Tabs  Take 1 tablet by mouth daily.     clonazePAM 0.5 MG tablet  Commonly known as:  KLONOPIN  Take 0.5 mg by mouth daily.     estrogens (conjugated) 1.25 MG tablet  Commonly known as:  PREMARIN  Take 1.25 mg by mouth daily.     meloxicam 15 MG tablet  Commonly known as:  MOBIC  Take 15 mg by mouth daily.     SYSTANE OP  Place 1 drop into both eyes daily.     Vitamin D (Cholecalciferol) 400 UNITS Caps  Take 400 Units by mouth daily.       Follow-up Information    Follow up with [facility].   Specialty:  Cardiology   Why:  for pacer check in 7-10 days or so the office will call with date and time   Contact information:   [address], [place] [phone]      Follow up with Will Meredith Leeds, MD.   Specialty:  Cardiology   Why:  the office will schedule an appt in 3 months for pacer check    Contact information:   [address] [place] [address] [phone]       Follow up with Dorris Carnes, MD.   Specialty:  Cardiology   Why:  office will call with appt date and time for  appt in 6 weeks or so.   Contact information:   [address] [place] [address] [phone]        Discharge Instructions:post pacemaker instructions Heart Healthy diet.  Signed: Isaiah Serge Nurse Practitioner-Certified [facility]: [facility] 05/24/2015, 9:03 AM  Time spent on discharge : > 30  minutes.     I have seen and examined this patient with Cecilie Kicks.  Agree with above, note added to reflect my findings.  On exam, regular rhythm, no murmurs, lungs clear.  Pacemaker was placed yesterday with no complications. Device check this morning shows stable leads and x-ray shows stable lead  position. Patient did have an episode of dizziness this morning without events on the monitor. We'll plan for discharge today. She would be able to drive as we have treated the cause of her syncope which was AV block.    Will M. Camnitz MD 05/24/2015 9:23 AM

## 2015-05-26 ENCOUNTER — Telehealth: Payer: Self-pay | Admitting: Internal Medicine

## 2015-05-26 ENCOUNTER — Encounter (HOSPITAL_COMMUNITY): Payer: Self-pay | Admitting: Cardiology

## 2015-05-26 MED FILL — Heparin Sodium (Porcine) 2 Unit/ML in Sodium Chloride 0.9%: INTRAMUSCULAR | Qty: 1000 | Status: AC

## 2015-05-26 NOTE — Telephone Encounter (Signed)
New Message   Pt calling just wants rn to know she is waiting on her call for 11/21 or 11/22  She states that rn and Dr.Ross is going to call for future instructions

## 2015-05-26 NOTE — Telephone Encounter (Signed)
Patient follows with Dr. Claiborne Billings. She had pacemaker implanted 11/18 and will need follow up appointments. Staff message sent to EP scheduler and NL triage.

## 2015-06-02 ENCOUNTER — Ambulatory Visit: Payer: Medicare Other

## 2015-06-02 ENCOUNTER — Ambulatory Visit (INDEPENDENT_AMBULATORY_CARE_PROVIDER_SITE_OTHER): Payer: Medicare Other | Admitting: *Deleted

## 2015-06-02 DIAGNOSIS — Z950 Presence of cardiac pacemaker: Secondary | ICD-10-CM | POA: Diagnosis not present

## 2015-06-02 DIAGNOSIS — R001 Bradycardia, unspecified: Secondary | ICD-10-CM | POA: Diagnosis not present

## 2015-06-02 DIAGNOSIS — I459 Conduction disorder, unspecified: Secondary | ICD-10-CM

## 2015-06-02 LAB — CUP PACEART INCLINIC DEVICE CHECK
Battery Voltage: 3.14 V
Brady Statistic AP VP Percent: 1.33 %
Brady Statistic RA Percent Paced: 2.35 %
Brady Statistic RV Percent Paced: 2.67 %
Date Time Interrogation Session: 20161128150106
Implantable Lead Implant Date: 20161118
Implantable Lead Location: 753860
Implantable Lead Model: 5076
Implantable Lead Model: 5076
Lead Channel Impedance Value: 494 Ohm
Lead Channel Pacing Threshold Amplitude: 0.5 V
Lead Channel Pacing Threshold Pulse Width: 0.4 ms
Lead Channel Pacing Threshold Pulse Width: 0.4 ms
Lead Channel Sensing Intrinsic Amplitude: 2.75 mV
Lead Channel Setting Sensing Sensitivity: 2 mV
MDC IDC LEAD IMPLANT DT: 20161118
MDC IDC LEAD LOCATION: 753859
MDC IDC MSMT LEADCHNL RA IMPEDANCE VALUE: 380 Ohm
MDC IDC MSMT LEADCHNL RA IMPEDANCE VALUE: 475 Ohm
MDC IDC MSMT LEADCHNL RA SENSING INTR AMPL: 3.125 mV
MDC IDC MSMT LEADCHNL RV IMPEDANCE VALUE: 399 Ohm
MDC IDC MSMT LEADCHNL RV PACING THRESHOLD AMPLITUDE: 0.75 V
MDC IDC MSMT LEADCHNL RV SENSING INTR AMPL: 6.625 mV
MDC IDC MSMT LEADCHNL RV SENSING INTR AMPL: 7.125 mV
MDC IDC SET LEADCHNL RA PACING AMPLITUDE: 3.5 V
MDC IDC SET LEADCHNL RV PACING AMPLITUDE: 3.5 V
MDC IDC SET LEADCHNL RV PACING PULSEWIDTH: 0.4 ms
MDC IDC STAT BRADY AP VS PERCENT: 1.02 %
MDC IDC STAT BRADY AS VP PERCENT: 1.34 %
MDC IDC STAT BRADY AS VS PERCENT: 96.31 %

## 2015-06-02 NOTE — Progress Notes (Signed)
Wound check appointment. Steri-strips removed. Wound without redness or edema. Incision edges approximated, wound well healed. Normal device function. Thresholds, sensing, and impedances consistent with implant measurements. Device programmed at 3.5V/auto capture programmed on for extra safety margin until 3 month visit. Histogram distribution appropriate for patient and level of activity. No mode switches or high ventricular rates noted. Patient educated about wound care, arm mobility, lifting restrictions. ROV w/ WC 08/25/15.

## 2015-06-03 ENCOUNTER — Telehealth: Payer: Self-pay | Admitting: Cardiology

## 2015-06-03 NOTE — Telephone Encounter (Signed)
Levada Dy tells me that patient is seeing physician today with inner ear issues and dizziness. They would like to give patient steroid shot. Advised ok to give shot.  Asked that she inform patient to call device clinic if dizziness issue persists, and not related to inner ear, so that her device may be checked/assessed.

## 2015-06-03 NOTE — Telephone Encounter (Signed)
New message      Pt is at her PCP's office with dizziness.  She has a pacemaker.  Can they give her a shot of depmedrol 80mg  for the dizziness?

## 2015-06-06 ENCOUNTER — Telehealth: Payer: Self-pay | Admitting: Cardiology

## 2015-06-06 NOTE — Telephone Encounter (Signed)
New message     Pt c/o of Chest Pain: STAT if CP now or developed within 24 hours  1. Are you having CP right now?  Chest tightness now 2. Are you experiencing any other symptoms (ex. SOB, nausea, vomiting, sweating)? Sob at times  3. How long have you been experiencing CP? Since 3:30 am 4. Is your CP continuous or coming and going?  Comes and goes  5. Have you taken Nitroglycerin? no Pt got a pacemaker on nov 18 ?

## 2015-06-06 NOTE — Telephone Encounter (Signed)
Patient stated that she woke up at 3:30 this am with some chest tightness, not severe, but noticeable.   Has been feeling better since she got up. Patient had pace maker placed on 11-21 and had a wound f/u on 06-02-15.  Stated that she really feels ok.  She was asking if this could be part of the healing process.  She also stated that she has an inner ear problem going on now that causes dizziness. She was given meclizine 25mg  to take for the dizziness.  She was also advised to sleep in a propped up on pillows position.  She says this feels very awkward and uncomfortable for her.  Advise patient that the changing of her sleep position is probably contributing to her discomfort.  Call back if you do not improve.  Advised that if she were to become symptomatic with CP, sob, nausea, vomiting or sweating to seek medical attention.  Patient stated that she thought that the sleeping position was her problem, but just wanted to check.

## 2015-06-18 ENCOUNTER — Telehealth: Payer: Self-pay | Admitting: Cardiology

## 2015-06-18 NOTE — Telephone Encounter (Signed)
New Message  Pt c/o of Chest Pain: 1. Are you having CP right now? Yes (Right now it is a 5)  2. Are you experiencing any other symptoms (ex. SOB, nausea, vomiting, sweating)? No 3. How long have you been experiencing CP? Within 24 hours  4. Is your CP continuous or coming and going? Coming and going  5. Have you taken Nitroglycerin? No. Pt states she doesn't have any.   Comments: in the mornings is when it is the worst. Pt req a call back to determine if this is apart of the healing process.

## 2015-06-18 NOTE — Telephone Encounter (Signed)
Spoke with pt and she states that she had a pacemaker placed on 05/23/15 and since then she has been having chest pain/soreness and SOB daily. Pt states that the CP seemed worse today. Pt states pain was at a 7 this AM but is now a 5.   Pt states pain is worse with movement but feels better (pain level 2) when she sits still. Pt states that she is also dizzy today. No vitals available but pt states that at her dermatologist appt today her BP was fine. Spoke with Dr. Curt Bears and he said that he would see the pt tomorrow in the office. Spoke with pt and scheduled appt for tomorrow at 3:45pm. Advised pt to take it easy tonight and if pain/SOB worsens to go to the ER. Pt verbalized understanding and was appreciative for assistance.

## 2015-06-19 ENCOUNTER — Encounter: Payer: Self-pay | Admitting: Cardiology

## 2015-06-19 ENCOUNTER — Ambulatory Visit
Admission: RE | Admit: 2015-06-19 | Discharge: 2015-06-19 | Disposition: A | Discharge status: Home / Self Care | Payer: Medicare Other | Source: Ambulatory Visit | Attending: Cardiology | Admitting: Cardiology

## 2015-06-19 ENCOUNTER — Ambulatory Visit (INDEPENDENT_AMBULATORY_CARE_PROVIDER_SITE_OTHER): Payer: Medicare Other | Admitting: Cardiology

## 2015-06-19 VITALS — BP 136/78 | HR 60 | Ht 65.5 in | Wt 174.2 lb

## 2015-06-19 DIAGNOSIS — I440 Atrioventricular block, first degree: Secondary | ICD-10-CM | POA: Diagnosis not present

## 2015-06-19 DIAGNOSIS — R079 Chest pain, unspecified: Secondary | ICD-10-CM

## 2015-06-19 DIAGNOSIS — R0602 Shortness of breath: Secondary | ICD-10-CM

## 2015-06-19 DIAGNOSIS — R001 Bradycardia, unspecified: Secondary | ICD-10-CM | POA: Diagnosis not present

## 2015-06-19 NOTE — Patient Instructions (Signed)
Medication Instructions:  Your physician recommends that you continue on your current medications as directed. Please refer to the Current Medication list given to you today.  Labwork: None ordered  Testing/Procedures: A chest x-ray takes a picture of the organs and structures inside the chest, including the heart, lungs, and blood vessels. This test can show several things, including, whether the heart is enlarges; whether fluid is building up in the lungs; and whether pacemaker / defibrillator leads are still in place.  Follow-Up: Keep follow up in February 2017 with Dr. Curt Bears.  If you need a refill on your cardiac medications before your next appointment, please call your pharmacy.  Thank you for choosing [facility]!!   Trinidad Curet, RN [phone]

## 2015-06-19 NOTE — Progress Notes (Signed)
Electrophysiology Office Note   Date:  06/19/2015   ID:  [name], DOB 14-Oct-1945, MRN [id number]  PCP:  Diana Noon, MD  Cardiologist:  [name] Primary Electrophysiologist:  Diana Rugg Meredith Leeds, MD    Chief Complaint  Patient presents with  . Pacemaker Check  . Shortness of Breath  . Dizziness  . Chest Pain     History of Present Illness: [name] is a 69 y.o. female who presents today for electrophysiology evaluation.   She had a dual chamber pacemaker placed 05/23/15.  She is presenting today as she has been having chest discomfort.  She says that the pain bothers her at different times of the day. The pain also bothers her when she is laying flat. The pain is mostly over her pacemaker site. There is no pain with a deep breath.she notices that she has to take many deep breaths when she is laying down in bed and often the pain occurs when she wakes up from sleep. She says that she has been adamant about not moving her arm so that the leads did not dislodge.   Today, she denies symptoms of palpitations,lower extremity edema, claudication, dizziness, presyncope, syncope, bleeding, or neurologic sequela. The patient is tolerating medications without difficulties and is otherwise without complaint today.    Past Medical History  Diagnosis Date  . Hyperlipidemia   . 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 05/24/2015  . Hx of syncope 05/24/2015  . 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 Heart block 05/23/2015  . Symptomatic bradycardia 05/24/2015   Past Surgical History  Procedure Laterality Date  . Transthoracic echocardiogram  11/12/2010    EF=>55%, moderate calcification of aortic valve leaflets, no MVP seen, normal LV thickness   . Nm myocar perf wall motion  11/03/2009    protocol: Persantine, no evidence of ischemia/infarct, post EF67%  . Cardiac catheterization  02/22/2008    no significant  CAD, 50% narrowing of proximal mid-dom. right Coronary artery. medical therapy along with antiplatlet therapy   . Ep implantable device N/A 05/23/2015    Procedure: Pacemaker Implant;  Surgeon: Diana Borelli Meredith Leeds, MD;  Location: [facility];  Service: Cardiovascular;  Laterality: N/A;     Current Outpatient Prescriptions  Medication Sig Dispense Refill  . aspirin 81 MG EC tablet Take 81 mg by mouth every other day. Swallow whole.    . Biotin 5000 MCG CAPS Take 5 mg by mouth daily.    . Calcium-Magnesium-Zinc 167-83-8 MG TABS Take 1 tablet by mouth daily.    . clonazePAM (KLONOPIN) 0.5 MG tablet Take 0.5 mg by mouth daily.    [name] Kitchen estrogens, conjugated, (PREMARIN) 1.25 MG tablet Take 1.25 mg by mouth daily.    . meclizine (ANTIVERT) 25 MG tablet Take 25 mg by mouth 2 (two) times daily as needed.    . meloxicam (MOBIC) 15 MG tablet Take 15 mg by mouth daily.    [name] (SYSTANE OP) Place 1 drop into both eyes daily.    . Vitamin D, Cholecalciferol, 400 UNITS CAPS Take 400 Units by mouth daily.     No current facility-administered medications for this visit.    Allergies:   Sulfa antibiotics   Social History:  The patient  reports that she has quit smoking. Her smoking use included Cigarettes. She has a 30 pack-year smoking history. She has never used smokeless tobacco. She reports that she drinks alcohol.  Family History:  The patient's family history includes Cancer in her mother; Diabetes in her brother, brother, maternal grandfather, and maternal grandmother; Heart disease in her brother, maternal grandfather, and maternal grandmother; Hyperlipidemia in her brother, brother, and son; Hypertension in her sister; Stroke in her paternal grandfather; Tuberculosis in her paternal grandmother.    ROS:  Please see the history of present illness.   Otherwise, review of systems is positive for chest pain.   All other systems are reviewed and negative.    PHYSICAL  EXAM: VS:  BP 136/78 mmHg  Pulse 60  Ht 5' 5.5" (1.664 m)  Wt 174 lb 3.2 oz (79.017 kg)  BMI 28.54 kg/m2 , BMI Body mass index is 28.54 kg/(m^2). GEN: Well nourished, well developed, in no acute distress HEENT: normal Neck: no JVD, carotid bruits, or masses Cardiac: RRR; no murmurs, rubs, or gallops,no edema  Respiratory:  clear to auscultation bilaterally, normal work of breathing GI: soft, nontender, nondistended, + BS MS: no deformity or atrophy Skin: warm and dry,  device pocket is well healed Neuro:  Strength and sensation are intact Psych: euthymic mood, full affect  EKG:  EKG is ordered today. The ekg ordered today shows AV paced.   Device interrogation is reviewed today in detail.  See PaceArt for details.   Recent Labs: 04/30/2015: ALT 14 05/21/2015: BUN 14; Creat 0.65; Hemoglobin 13.8; Platelets 219; Potassium 4.3; Sodium 139    Lipid Panel     Component Value Date/Time   CHOL 183 01/14/2014 0928   TRIG 118 01/14/2014 0928   HDL 65 01/14/2014 0928   CHOLHDL 2.8 01/14/2014 0928   VLDL 24 01/14/2014 0928   LDLCALC 94 01/14/2014 0928     Wt Readings from Last 3 Encounters:  06/19/15 174 lb 3.2 oz (79.017 kg)  05/24/15 181 lb 11.2 oz (82.419 kg)  05/21/15 171 lb (77.565 kg)      Other studies Reviewed: Additional studies/ records that were reviewed today include: TTE 04/30/15 Review of the above records today demonstrates:  - Left ventricle: The cavity size was normal. Systolic function was normal. The estimated ejection fraction was in the range of 60% to 65%. Wall motion was normal; there were no regional wall motion abnormalities. Doppler parameters are consistent with abnormal left ventricular relaxation (grade 1 diastolic dysfunction). - Aortic valve: Cusp separation was reduced. Transvalvular velocity was minimally increased. There was no stenosis. Peak velocity (S): 224 cm/s. Mean gradient (S): 9 mm Hg. - Mitral valve: Calcified  annulus. There was mild regurgitation. - Left atrium: The atrium was mildly dilated.  ASSESSMENT AND PLAN:  1.  Chest pain: pain over the device site.  After changing the base rate to 50 from 60, her pain went away.  She is currently feeling well.  Due to her chest pain, Anona Giovannini order a chest x-ray to determine if she is having pain from some other complication of the device placement.  2. Heart block: pacemaker functioning appropriately, no issues with block.   Current medicines are reviewed at length with the patient today.   The patient does not have concerns regarding her medicines.  The following changes were made today:  none  Labs/ tests ordered today include:  Orders Placed This Encounter  Procedures  . DG Chest 2 View  . EKG 12-Lead     Disposition:   FU with Yaritzy Huser 2 months  Signed, Asyia Hornung Meredith Leeds, MD  06/19/2015 4:13 PM     [address] [place] [address] [phone] (office) [phone] (fax)

## 2015-06-23 ENCOUNTER — Telehealth: Payer: Self-pay | Admitting: Cardiology

## 2015-06-23 DIAGNOSIS — Z01812 Encounter for preprocedural laboratory examination: Secondary | ICD-10-CM

## 2015-06-23 LAB — CUP PACEART INCLINIC DEVICE CHECK
Brady Statistic AS VP Percent: 1.7 %
Brady Statistic AS VS Percent: 86.94 %
Implantable Lead Implant Date: 20161118
Implantable Lead Implant Date: 20161118
Implantable Lead Location: 753860
Implantable Lead Model: 5076
Lead Channel Impedance Value: 380 Ohm
Lead Channel Impedance Value: 513 Ohm
Lead Channel Pacing Threshold Amplitude: 0.5 V
Lead Channel Pacing Threshold Amplitude: 1.5 V
Lead Channel Pacing Threshold Pulse Width: 0.4 ms
Lead Channel Sensing Intrinsic Amplitude: 2.5 mV
Lead Channel Sensing Intrinsic Amplitude: 8.5 mV
Lead Channel Setting Pacing Amplitude: 3.5 V
Lead Channel Setting Sensing Sensitivity: 2 mV
MDC IDC LEAD LOCATION: 753859
MDC IDC MSMT BATTERY REMAINING LONGEVITY: 157 mo
MDC IDC MSMT BATTERY VOLTAGE: 3.13 V
MDC IDC MSMT LEADCHNL RA PACING THRESHOLD PULSEWIDTH: 0.4 ms
MDC IDC MSMT LEADCHNL RA SENSING INTR AMPL: 2.625 mV
MDC IDC MSMT LEADCHNL RV IMPEDANCE VALUE: 399 Ohm
MDC IDC MSMT LEADCHNL RV IMPEDANCE VALUE: 494 Ohm
MDC IDC MSMT LEADCHNL RV SENSING INTR AMPL: 7.375 mV
MDC IDC SESS DTM: 20161215215651
MDC IDC SET LEADCHNL RV PACING AMPLITUDE: 3.5 V
MDC IDC SET LEADCHNL RV PACING PULSEWIDTH: 0.4 ms
MDC IDC STAT BRADY AP VP PERCENT: 8.13 %
MDC IDC STAT BRADY AP VS PERCENT: 3.24 %
MDC IDC STAT BRADY RA PERCENT PACED: 11.36 %
MDC IDC STAT BRADY RV PERCENT PACED: 9.82 %

## 2015-06-23 NOTE — Telephone Encounter (Signed)
Reports chest discomfort same as what she was seen for, in office, last week. States pain is around her heart, "behind her nipple".  She denies pain around device site, SOB, edema, light-headedness. She is aware that Dr. Curt Bears is out of the office until tomorrow and would like to wait for his recommendations. Advised if pain worsens/changes/spreads to go to nearest ED for evaluation. Patient verbalized understanding and agreeable to plan.

## 2015-06-23 NOTE — Telephone Encounter (Signed)
New message      Pt c/o of Chest Pain: STAT if CP now or developed within 24 hours  1. Are you having CP right now?  Having chest discomfort now 2. Are you experiencing any other symptoms (ex. SOB, nausea, vomiting, sweating)?  headache  3. How long have you been experiencing CP? Since last night 4. Is your CP continuous or coming and going? Comes and goes  5. Have you taken Nitroglycerin?  No Pt was seen last week for this same problem.   ?

## 2015-06-24 NOTE — Telephone Encounter (Signed)
Informed patient [name] would like to schedule a lead revision. Patient is agreeable to 07/18/15. Will call patient tomorrow to review instructions and patient is agreeable.

## 2015-06-25 ENCOUNTER — Encounter: Payer: Self-pay | Admitting: *Deleted

## 2015-06-25 NOTE — Telephone Encounter (Signed)
Reviewed procedure instructions with patient. Letter of instructions mailed to home address. Wound check scheduled for 07/30/15. Patient verbalized understanding and agreeable to plan.

## 2015-06-26 ENCOUNTER — Encounter: Payer: Self-pay | Admitting: *Deleted

## 2015-06-26 NOTE — Telephone Encounter (Signed)
Called patient to offer her an earlier procedure time. Patient agreeable to move procedure to 07/10/2015. Pre procedure lab date to remain the same. Wound check rescheduled to 07/23/2015. Updated/revised letter of instructions mailed to home address, per pt request. Patient verbalized understanding and agreeable to plan.

## 2015-07-08 ENCOUNTER — Encounter: Payer: Self-pay | Admitting: Cardiology

## 2015-07-08 ENCOUNTER — Other Ambulatory Visit (INDEPENDENT_AMBULATORY_CARE_PROVIDER_SITE_OTHER): Payer: Medicare Other | Admitting: *Deleted

## 2015-07-08 DIAGNOSIS — Z01812 Encounter for preprocedural laboratory examination: Secondary | ICD-10-CM | POA: Diagnosis not present

## 2015-07-08 LAB — CBC WITH DIFFERENTIAL/PLATELET
BASOS ABS: 0.1 10*3/uL (ref 0.0–0.1)
Basophils Relative: 1 % (ref 0–1)
Eosinophils Absolute: 0.1 10*3/uL (ref 0.0–0.7)
Eosinophils Relative: 1 % (ref 0–5)
HEMATOCRIT: 40.2 % (ref 36.0–46.0)
HEMOGLOBIN: 14.1 g/dL (ref 12.0–15.0)
LYMPHS PCT: 37 % (ref 12–46)
Lymphs Abs: 3.2 10*3/uL (ref 0.7–4.0)
MCH: 31.5 pg (ref 26.0–34.0)
MCHC: 35.1 g/dL (ref 30.0–36.0)
MCV: 89.7 fL (ref 78.0–100.0)
MONO ABS: 0.9 10*3/uL (ref 0.1–1.0)
MPV: 11.6 fL (ref 8.6–12.4)
Monocytes Relative: 10 % (ref 3–12)
NEUTROS ABS: 4.4 10*3/uL (ref 1.7–7.7)
Neutrophils Relative %: 51 % (ref 43–77)
Platelets: 233 10*3/uL (ref 150–400)
RBC: 4.48 MIL/uL (ref 3.87–5.11)
RDW: 14.1 % (ref 11.5–15.5)
WBC: 8.6 10*3/uL (ref 4.0–10.5)

## 2015-07-08 LAB — BASIC METABOLIC PANEL
BUN: 12 mg/dL (ref 7–25)
CHLORIDE: 102 mmol/L (ref 98–110)
CO2: 26 mmol/L (ref 20–31)
Calcium: 9.2 mg/dL (ref 8.6–10.4)
Creat: 0.66 mg/dL (ref 0.50–0.99)
GLUCOSE: 81 mg/dL (ref 65–99)
POTASSIUM: 4.1 mmol/L (ref 3.5–5.3)
SODIUM: 139 mmol/L (ref 135–146)

## 2015-07-09 ENCOUNTER — Other Ambulatory Visit: Payer: Self-pay | Admitting: Physician Assistant

## 2015-07-10 ENCOUNTER — Encounter (HOSPITAL_COMMUNITY)
Admission: RE | Disposition: A | Discharge status: Home / Self Care | Payer: Medicare Other | Source: Ambulatory Visit | Attending: Cardiology

## 2015-07-10 ENCOUNTER — Ambulatory Visit (HOSPITAL_COMMUNITY)
Admission: RE | Admit: 2015-07-10 | Discharge: 2015-07-11 | Disposition: A | Discharge status: Home / Self Care | Payer: Medicare Other | Source: Ambulatory Visit | Attending: Cardiology | Admitting: Cardiology

## 2015-07-10 ENCOUNTER — Encounter (HOSPITAL_COMMUNITY): Payer: Self-pay | Admitting: Cardiology

## 2015-07-10 DIAGNOSIS — Z7982 Long term (current) use of aspirin: Secondary | ICD-10-CM | POA: Diagnosis not present

## 2015-07-10 DIAGNOSIS — Z950 Presence of cardiac pacemaker: Secondary | ICD-10-CM | POA: Diagnosis not present

## 2015-07-10 DIAGNOSIS — Z45018 Encounter for adjustment and management of other part of cardiac pacemaker: Secondary | ICD-10-CM | POA: Diagnosis not present

## 2015-07-10 DIAGNOSIS — Z79899 Other long term (current) drug therapy: Secondary | ICD-10-CM | POA: Insufficient documentation

## 2015-07-10 DIAGNOSIS — I442 Atrioventricular block, complete: Secondary | ICD-10-CM | POA: Insufficient documentation

## 2015-07-10 DIAGNOSIS — E785 Hyperlipidemia, unspecified: Secondary | ICD-10-CM | POA: Insufficient documentation

## 2015-07-10 DIAGNOSIS — T82120D Displacement of cardiac electrode, subsequent encounter: Secondary | ICD-10-CM

## 2015-07-10 DIAGNOSIS — R079 Chest pain, unspecified: Secondary | ICD-10-CM | POA: Insufficient documentation

## 2015-07-10 DIAGNOSIS — R55 Syncope and collapse: Secondary | ICD-10-CM | POA: Diagnosis not present

## 2015-07-10 DIAGNOSIS — Z87891 Personal history of nicotine dependence: Secondary | ICD-10-CM | POA: Diagnosis not present

## 2015-07-10 DIAGNOSIS — Z95818 Presence of other cardiac implants and grafts: Secondary | ICD-10-CM

## 2015-07-10 DIAGNOSIS — T82110A Breakdown (mechanical) of cardiac electrode, initial encounter: Secondary | ICD-10-CM | POA: Diagnosis present

## 2015-07-10 HISTORY — DX: Anxiety disorder, unspecified: F41.9

## 2015-07-10 HISTORY — DX: Cardiac murmur, unspecified: R01.1

## 2015-07-10 HISTORY — DX: Presence of cardiac pacemaker: Z95.0

## 2015-07-10 HISTORY — PX: EP IMPLANTABLE DEVICE: SHX172B

## 2015-07-10 LAB — SURGICAL PCR SCREEN
MRSA, PCR: NEGATIVE
STAPHYLOCOCCUS AUREUS: NEGATIVE

## 2015-07-10 SURGERY — LEAD REVISION/REPAIR

## 2015-07-10 MED ORDER — MECLIZINE HCL 25 MG PO TABS: 25.0000 mg | ORAL_TABLET | Freq: Every day | ORAL | Status: DC | PRN

## 2015-07-10 MED ORDER — LIDOCAINE HCL (PF) 1 % IJ SOLN
INTRAMUSCULAR | Status: DC | PRN
  Administered 2015-07-10: 50 mL

## 2015-07-10 MED ORDER — CEFAZOLIN SODIUM-DEXTROSE 2-3 GM-% IV SOLR
2.0000 g | INTRAVENOUS | Status: AC
  Administered 2015-07-10: 2 g via INTRAVENOUS

## 2015-07-10 MED ORDER — CLONAZEPAM 0.5 MG PO TABS
0.5000 mg | ORAL_TABLET | Freq: Every day | ORAL | Status: DC
  Filled 2015-07-10: qty 1

## 2015-07-10 MED ORDER — MUPIROCIN 2 % EX OINT
1.0000 "application " | TOPICAL_OINTMENT | Freq: Once | CUTANEOUS | Status: AC
  Administered 2015-07-10: 1 via TOPICAL
  Filled 2015-07-10: qty 22

## 2015-07-10 MED ORDER — SODIUM CHLORIDE 0.9 % IV SOLN
INTRAVENOUS | Status: DC | PRN
  Administered 2015-07-10: 10 mL/h via INTRAVENOUS

## 2015-07-10 MED ORDER — SODIUM CHLORIDE 0.9 % IR SOLN
Status: DC | PRN
  Administered 2015-07-10: 09:00:00

## 2015-07-10 MED ORDER — SODIUM CHLORIDE 0.9 % IR SOLN
Status: AC
  Filled 2015-07-10: qty 2

## 2015-07-10 MED ORDER — OXYCODONE HCL 5 MG PO TABS
5.0000 mg | ORAL_TABLET | ORAL | Status: DC | PRN
  Administered 2015-07-10 – 2015-07-11 (×3): 5 mg via ORAL
  Filled 2015-07-10 (×2): qty 1

## 2015-07-10 MED ORDER — LIDOCAINE HCL (PF) 1 % IJ SOLN
INTRAMUSCULAR | Status: AC
  Filled 2015-07-10: qty 30

## 2015-07-10 MED ORDER — ONDANSETRON HCL 4 MG/2ML IJ SOLN
4.0000 mg | Freq: Four times a day (QID) | INTRAMUSCULAR | Status: DC | PRN
  Administered 2015-07-10: 4 mg via INTRAVENOUS
  Filled 2015-07-10: qty 2

## 2015-07-10 MED ORDER — ASPIRIN EC 81 MG PO TBEC
81.0000 mg | DELAYED_RELEASE_TABLET | ORAL | Status: DC
  Administered 2015-07-10: 81 mg via ORAL
  Filled 2015-07-10: qty 1

## 2015-07-10 MED ORDER — MIDAZOLAM HCL 5 MG/5ML IJ SOLN
INTRAMUSCULAR | Status: DC | PRN
  Administered 2015-07-10 (×5): 1 mg via INTRAVENOUS

## 2015-07-10 MED ORDER — SODIUM CHLORIDE 0.9 % IV SOLN
INTRAVENOUS | Status: DC
  Administered 2015-07-10: 07:00:00 via INTRAVENOUS

## 2015-07-10 MED ORDER — GENTAMICIN SULFATE 40 MG/ML IJ SOLN: 80.0000 mg | INTRAMUSCULAR | Status: DC

## 2015-07-10 MED ORDER — MIDAZOLAM HCL 5 MG/5ML IJ SOLN
INTRAMUSCULAR | Status: AC
  Filled 2015-07-10: qty 5

## 2015-07-10 MED ORDER — FENTANYL CITRATE (PF) 100 MCG/2ML IJ SOLN
INTRAMUSCULAR | Status: AC
  Filled 2015-07-10: qty 2

## 2015-07-10 MED ORDER — ACETAMINOPHEN 325 MG PO TABS: 325.0000 mg | ORAL_TABLET | ORAL | Status: DC | PRN

## 2015-07-10 MED ORDER — FENTANYL CITRATE (PF) 100 MCG/2ML IJ SOLN
INTRAMUSCULAR | Status: DC | PRN
  Administered 2015-07-10 (×2): 25 ug via INTRAVENOUS
  Administered 2015-07-10: 50 ug via INTRAVENOUS

## 2015-07-10 MED ORDER — OXYCODONE HCL 5 MG PO TABS
ORAL_TABLET | ORAL | Status: AC
  Filled 2015-07-10: qty 1

## 2015-07-10 MED ORDER — MUPIROCIN 2 % EX OINT
TOPICAL_OINTMENT | CUTANEOUS | Status: AC
  Administered 2015-07-10: 1 via TOPICAL
  Filled 2015-07-10: qty 22

## 2015-07-10 MED ORDER — HEPARIN (PORCINE) IN NACL 2-0.9 UNIT/ML-% IJ SOLN
INTRAMUSCULAR | Status: AC
  Filled 2015-07-10: qty 1000

## 2015-07-10 MED ORDER — CEFAZOLIN SODIUM 1-5 GM-% IV SOLN
1.0000 g | Freq: Four times a day (QID) | INTRAVENOUS | Status: AC
Start: 2015-07-10 — End: 2015-07-11
  Administered 2015-07-10 – 2015-07-11 (×3): 1 g via INTRAVENOUS
  Filled 2015-07-10 (×3): qty 50

## 2015-07-10 MED ORDER — CHLORHEXIDINE GLUCONATE 4 % EX LIQD
60.0000 mL | Freq: Once | CUTANEOUS | Status: DC
  Filled 2015-07-10: qty 60

## 2015-07-10 MED ORDER — CEFAZOLIN SODIUM-DEXTROSE 2-3 GM-% IV SOLR
INTRAVENOUS | Status: AC
  Filled 2015-07-10: qty 50

## 2015-07-10 SURGICAL SUPPLY — 3 items
CABLE SURGICAL S-101-97-12 (CABLE) ×2 IMPLANT
PAD DEFIB LIFELINK (PAD) ×2 IMPLANT
TRAY PACEMAKER INSERTION (PACKS) ×2 IMPLANT

## 2015-07-10 NOTE — Discharge Instructions (Signed)
° ° °  Supplemental Discharge Instructions for  °Pacemaker/Defibrillator Patients ° °Activity °No heavy lifting or vigorous activity with your left/right arm for 6 to 8 weeks.  Do not raise your left/right arm above your head for one week.  Gradually raise your affected arm as drawn below. ° °        °  07/15/15                     07/16/15                     07/17/15                  07/18/15 °__ ° °NO DRIVING for  1 week   ; you may begin driving on 07/18/15    . ° °WOUND CARE °- Keep the wound area clean and dry.  Do not get this area wet for one week. No showers for one week; you may shower on  07/18/15   . °- The tape/steri-strips on your wound will fall off; do not pull them off.  No bandage is needed on the site.  DO  NOT apply any creams, oils, or ointments to the wound area. °- If you notice any drainage or discharge from the wound, any swelling or bruising at the site, or you develop a fever > 101? F after you are discharged home, call the office at once. ° °Special Instructions °- You are still able to use cellular telephones; use the ear opposite the side where you have your pacemaker/defibrillator.  Avoid carrying your cellular phone near your device. °- When traveling through airports, show security personnel your identification card to avoid being screened in the metal detectors.  Ask the security personnel to use the hand wand. °- Avoid arc welding equipment, MRI testing (magnetic resonance imaging), TENS units (transcutaneous nerve stimulators).  Call the office for questions about other devices. °- Avoid electrical appliances that are in poor condition or are not properly grounded. °- Microwave ovens are safe to be near or to operate. ° °Additional information for defibrillator patients should your device go off: °- If your device goes off ONCE and you feel fine afterward, notify the device clinic nurses. °- If your device goes off ONCE and you do not feel well afterward, call 911. °- If your device goes  off TWICE, call 911. °- If your device goes off THREE times in one day, call 911. ° °DO NOT DRIVE YOURSELF OR A FAMILY MEMBER °WITH A DEFIBRILLATOR TO THE HOSPITAL--CALL 911. °

## 2015-07-10 NOTE — Discharge Summary (Signed)
ELECTROPHYSIOLOGY PROCEDURE DISCHARGE SUMMARY    Patient ID: [name],  MRN: [id number], DOB/AGE: 1945/08/31 70 y.o.  Admit date: 07/10/2015 Discharge date:   Primary Care Physician: Diana Noon, MD Primary Cardiologist: Dr. Claiborne Rhodes Electrophysiologist: [name]  Primary Discharge Diagnosis:  1. CHB, PPM lead revision  Secondary Discharge Diagnosis:  1. HLD  Allergies  Allergen Reactions  . Sulfa Antibiotics Rash     Procedures This Admission:  PPM RA and RV  lead revision     CONCLUSIONS:  1. Successful lead revision of a Medtronic Advisa L dual-chamber pacemaker for symptomatic bradycardia 2. No early apparent complications.   CXR on 07/11/15 demonstrated no pneumothorax status post device implantation.   Brief HPI: [name] is a 70 y.o. female was underwent PPM implant November 2016 for CHB.  She was seen in clinic with complaints of pain at the device site as well as CP and was scheduled for pocket evaluation and lead revision by [name].  Past medical history includes CHB, RBBB, HLD.  Risks, benefits, and alternatives to the procedure were reviewed with the patient who wished to proceed.   Hospital Course:  The patient was admitted and underwent lead revision 07/10/15 with details as outlined above. She was monitored on telemetry overnight which demonstrated SR, vitals/BP look good.  Left chest was without hematoma or ecchymosis. The device was interrogated and found to be functioning normally.  CXR was obtained and demonstrated no pneumothorax status post device implantation.  Wound care, arm mobility, and restrictions were re-reviewed with the patient.  The patient was examined by [name] and considered stable for discharge to home.    Physical Exam: Filed Vitals:   07/10/15 1637 07/10/15 1744 07/10/15 2005 07/11/15 0500  BP: 111/52 119/53 110/57 119/51  Pulse: 63 61 57 63  Temp:   97.5 F (36.4 C) 97.6 F (36.4 C)  TempSrc:    Oral Oral  Resp: 15 14 13 14   Height:      Weight:    173 lb 8 oz (78.699 kg)  SpO2: 93% 96% 95% 97%    GEN- The patient is well appearing, alert and oriented x 3 today.   HEENT: normocephalic, atraumatic; sclera clear, conjunctiva pink; hearing intact; oropharynx clear; neck supple, no JVP Lungs- Clear to ausculation bilaterally, normal work of breathing.  No wheezes, rales, rhonchi Heart- Regular rate and rhythm, no murmurs, rubs or gallops, PMI not laterally displaced GI- soft, non-tender, non-distended, bowel sounds present, no hepatosplenomegaly Extremities- no clubbing, cyanosis, or edema MS- no significant deformity or atrophy Skin- warm and dry, no rash or lesion, left chest without hematoma/ecchymosis Psych- euthymic mood, full affect Neuro- no gross deficits   Labs:   Lab Results  Component Value Date   WBC 8.6 07/08/2015   HGB 14.1 07/08/2015   HCT 40.2 07/08/2015   MCV 89.7 07/08/2015   PLT 233 07/08/2015     Recent Labs Lab 07/08/15 1350  NA 139  K 4.1  CL 102  CO2 26  BUN 12  CREATININE 0.66  CALCIUM 9.2  GLUCOSE 81   07/11/15 CXR FINDINGS: The heart size and mediastinal contours are within normal limits. Both lungs are clear. Left-sided pacemaker is noted with leads in grossly good position. No pneumothorax or pleural effusion is noted. The visualized skeletal structures are unremarkable. IMPRESSION: No active cardiopulmonary disease.  Discharge Medications:    Medication List    TAKE these medications  aspirin 81 MG EC tablet  Take 81 mg by mouth every other day. Swallow whole.     Calcium-Magnesium-Zinc 167-83-8 MG Tabs  Take 1 tablet by mouth daily.     clonazePAM 0.5 MG tablet  Commonly known as:  KLONOPIN  Take 0.5 mg by mouth daily.     estrogens (conjugated) 1.25 MG tablet  Commonly known as:  PREMARIN  Take 1.25 mg by mouth daily.     HAIR/SKIN/NAILS Tabs  Take 1 tablet by mouth daily.     meclizine 25 MG tablet    Commonly known as:  ANTIVERT  Take 25 mg by mouth daily as needed for dizziness.     meloxicam 15 MG tablet  Commonly known as:  MOBIC  Take 15 mg by mouth daily.     SYSTANE OP  Place 1 drop into both eyes daily.     Vitamin D (Cholecalciferol) 400 units Caps  Take 400 Units by mouth daily.        Disposition: Home  Follow-up Information    Follow up with [facility] On 07/23/2015.   Specialty:  Cardiology   Why:  2:30PM wound check   Contact information:   [address] [place] [phone]      Follow up with Diana Meredith Leeds, MD.   Specialty:  Cardiology   Why:  office scheduler Diana call to schedule a 3 month office visit   Contact information:   [place] [address] [phone]       Duration of Discharge Encounter: Greater than 30 minutes including physician time.  SignedTommye Standard, PA-C 07/11/2015 9:03 AM    I have seen and examined this patient with [name].  Agree with above, note added to reflect my findings.  On exam, regular rhythm, no murmurs, lungs clear.  Had lead revision of pacemaker due to increased pain.  Pain improved post procedure without complaint.  Diana plan on discharge with usual follow up.  Diana M. Camnitz MD 07/14/2015 10:55 AM

## 2015-07-10 NOTE — H&P (Signed)
Electrophysiology Office Note   Date: 06/19/2015   ID: Johnny Bertin Dumler, DOB 23-Aug-1945, MRN [id number]  PCP: Chesley Noon, MD Cardiologist: Claiborne Billings Primary Electrophysiologist: Crimson Beer Meredith Leeds, MD   Chief Complaint  Patient presents with  . Pacemaker Check  . Shortness of Breath  . Dizziness  . Chest Pain    History of Present Illness: MIRENA BELAN is a 70 y.o. female who presents today for electrophysiology evaluation. She had a dual chamber pacemaker placed 05/23/15. She is presenting today as she has been having chest discomfort. She says that the pain bothers her at different times of the day. The pain also bothers her when she is laying flat. The pain is mostly over her pacemaker site. There is no pain with a deep breath.she notices that she has to take many deep breaths when she is laying down in bed and often the pain occurs when she wakes up from sleep. She says that she has been adamant about not moving her arm so that the leads did not dislodge.   Today, she denies symptoms of palpitations,lower extremity edema, claudication, dizziness, presyncope, syncope, bleeding, or neurologic sequela. The patient is tolerating medications without difficulties and is otherwise without complaint today.    Past Medical History  Diagnosis Date  . Hyperlipidemia   . 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 05/24/2015  . Hx of syncope 05/24/2015  . 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 Heart block 05/23/2015  . Symptomatic bradycardia 05/24/2015   Past Surgical History  Procedure Laterality Date  . Transthoracic echocardiogram  11/12/2010    EF=>55%, moderate calcification of aortic valve leaflets, no MVP seen, normal LV thickness   . Nm myocar perf wall motion  11/03/2009    protocol: Persantine, no evidence of ischemia/infarct, post EF67%   . Cardiac catheterization  02/22/2008    no significant CAD, 50% narrowing of proximal mid-dom. right Coronary artery. medical therapy along with antiplatlet therapy   . Ep implantable device N/A 05/23/2015    Procedure: Pacemaker Implant; Surgeon: Okema Rollinson Meredith Leeds, MD; Location: [facility]; Service: Cardiovascular; Laterality: N/A;     Current Outpatient Prescriptions  Medication Sig Dispense Refill  . aspirin 81 MG EC tablet Take 81 mg by mouth every other day. Swallow whole.    . Biotin 5000 MCG CAPS Take 5 mg by mouth daily.    . Calcium-Magnesium-Zinc 167-83-8 MG TABS Take 1 tablet by mouth daily.    . clonazePAM (KLONOPIN) 0.5 MG tablet Take 0.5 mg by mouth daily.    Marland Kitchen estrogens, conjugated, (PREMARIN) 1.25 MG tablet Take 1.25 mg by mouth daily.    . meclizine (ANTIVERT) 25 MG tablet Take 25 mg by mouth 2 (two) times daily as needed.    . meloxicam (MOBIC) 15 MG tablet Take 15 mg by mouth daily.    Vladimir Faster Glycol-Propyl Glycol (SYSTANE OP) Place 1 drop into both eyes daily.    . Vitamin D, Cholecalciferol, 400 UNITS CAPS Take 400 Units by mouth daily.     No current facility-administered medications for this visit.    Allergies: Sulfa antibiotics   Social History: The patient  reports that she has quit smoking. Her smoking use included Cigarettes. She has a 30 pack-year smoking history. She has never used smokeless tobacco. She reports that she drinks alcohol.   Family History: The patient's family history includes Cancer in her mother; Diabetes in her brother, brother, maternal grandfather, and  maternal grandmother; Heart disease in her brother, maternal grandfather, and maternal grandmother; Hyperlipidemia in her brother, brother, and son; Hypertension in her sister; Stroke in her paternal grandfather; Tuberculosis in her paternal grandmother.    ROS: Please see the history of present  illness. Otherwise, review of systems is positive for chest pain. All other systems are reviewed and negative.    PHYSICAL EXAM: VS: BP 136/78 mmHg  Pulse 60  Ht 5' 5.5" (1.664 m)  Wt 174 lb 3.2 oz (79.017 kg)  BMI 28.54 kg/m2 , BMI Body mass index is 28.54 kg/(m^2). GEN: Well nourished, well developed, in no acute distress  HEENT: normal  Neck: no JVD, carotid bruits, or masses Cardiac: RRR; no murmurs, rubs, or gallops,no edema  Respiratory: clear to auscultation bilaterally, normal work of breathing GI: soft, nontender, nondistended, + BS MS: no deformity or atrophy  Skin: warm and dry, device pocket is well healed Neuro: Strength and sensation are intact Psych: euthymic mood, full affect  EKG: EKG is ordered today. The ekg ordered today shows AV paced.  Device interrogation is reviewed today in detail. See PaceArt for details.   Recent Labs: 04/30/2015: ALT 14 05/21/2015: BUN 14; Creat 0.65; Hemoglobin 13.8; Platelets 219; Potassium 4.3; Sodium 139    Lipid Panel   Labs (Brief)       Component Value Date/Time   CHOL 183 01/14/2014 0928   TRIG 118 01/14/2014 0928   HDL 65 01/14/2014 0928   CHOLHDL 2.8 01/14/2014 0928   VLDL 24 01/14/2014 0928   LDLCALC 94 01/14/2014 0928       Wt Readings from Last 3 Encounters:  06/19/15 174 lb 3.2 oz (79.017 kg)  05/24/15 181 lb 11.2 oz (82.419 kg)  05/21/15 171 lb (77.565 kg)      Other studies Reviewed: Additional studies/ records that were reviewed today include: TTE 04/30/15 Review of the above records today demonstrates:  - Left ventricle: The cavity size was normal. Systolic function was normal. The estimated ejection fraction was in the range of 60% to 65%. Wall motion was normal; there were no regional wall motion abnormalities. Doppler parameters are consistent with abnormal left ventricular relaxation (grade 1 diastolic dysfunction). - Aortic  valve: Cusp separation was reduced. Transvalvular velocity was minimally increased. There was no stenosis. Peak velocity (S): 224 cm/s. Mean gradient (S): 9 mm Hg. - Mitral valve: Calcified annulus. There was mild regurgitation. - Left atrium: The atrium was mildly dilated.  ASSESSMENT AND PLAN:  1. Chest pain: pain over the device site. After changing the base rate to 50 from 60, her pain went away. She is currently feeling well. Due to her chest pain, Melida Northington order a chest x-ray to determine if she is having pain from some other complication of the device placement.  2. Heart block: pacemaker functioning appropriately, no issues with block.         Patient with chest pain over device site.  Pain not amenable to changes in device.  Nothing that she has done has relieved the pain.  Zein Helbing plan on pocket revision today.  Leads working well.  Discussed the risks and benefits with the patient including bleeding and infection.  Infection risk is 2-3%.  Patient understands risks and we Blessing Ozga proceed with the pocket revision.  Ramata Strothman 07/10/2015 7:10 AM

## 2015-07-10 NOTE — Progress Notes (Signed)
Assisted  To walk to BR. Back to stretcher. Tolerated well. Visitor in. Eating [place] sandwich.

## 2015-07-11 ENCOUNTER — Ambulatory Visit (HOSPITAL_COMMUNITY): Payer: Medicare Other

## 2015-07-11 ENCOUNTER — Encounter (HOSPITAL_COMMUNITY): Payer: Self-pay | Admitting: Physician Assistant

## 2015-07-11 DIAGNOSIS — Z45018 Encounter for adjustment and management of other part of cardiac pacemaker: Secondary | ICD-10-CM | POA: Diagnosis not present

## 2015-07-11 DIAGNOSIS — E785 Hyperlipidemia, unspecified: Secondary | ICD-10-CM | POA: Diagnosis not present

## 2015-07-11 DIAGNOSIS — I442 Atrioventricular block, complete: Secondary | ICD-10-CM | POA: Diagnosis not present

## 2015-07-11 DIAGNOSIS — R55 Syncope and collapse: Secondary | ICD-10-CM | POA: Diagnosis not present

## 2015-07-11 MED FILL — Heparin Sodium (Porcine) 2 Unit/ML in Sodium Chloride 0.9%: INTRAMUSCULAR | Qty: 1000 | Status: AC

## 2015-07-11 NOTE — Progress Notes (Signed)
NURSING PROGRESS NOTE  [name] [id number] Discharge Data: 07/11/2015 10:45 AM Attending Provider: No att. providers found [id number] C, MD     Roderic Palau Saavedra to be D/C'd Home per MD order.  Discussed with the patient the After Visit Summary and all questions fully answered. All IV's discontinued with no bleeding noted. All belongings returned to patient for patient to take home.   Last Vital Signs:  Blood pressure 119/51, pulse 63, temperature 97.6 F (36.4 C), temperature source Oral, resp. rate 14, height 5' 5.5" (1.664 m), weight 78.699 kg (173 lb 8 oz), SpO2 97 %.  Discharge Medication List   Medication List    TAKE these medications        aspirin 81 MG EC tablet  Take 81 mg by mouth every other day. Swallow whole.     Calcium-Magnesium-Zinc 167-83-8 MG Tabs  Take 1 tablet by mouth daily.     clonazePAM 0.5 MG tablet  Commonly known as:  KLONOPIN  Take 0.5 mg by mouth daily.     estrogens (conjugated) 1.25 MG tablet  Commonly known as:  PREMARIN  Take 1.25 mg by mouth daily.     HAIR/SKIN/NAILS Tabs  Take 1 tablet by mouth daily.     meclizine 25 MG tablet  Commonly known as:  ANTIVERT  Take 25 mg by mouth daily as needed for dizziness.     meloxicam 15 MG tablet  Commonly known as:  MOBIC  Take 15 mg by mouth daily.     SYSTANE OP  Place 1 drop into both eyes daily.     Vitamin D (Cholecalciferol) 400 units Caps  Take 400 Units by mouth daily.

## 2015-07-11 NOTE — Plan of Care (Signed)
Problem: Phase III Progression Outcomes Goal: Limited arm movement per orders Outcome: Progressing Pt arm is in a sling to limit movement per order.

## 2015-07-23 ENCOUNTER — Ambulatory Visit: Payer: Medicare Other

## 2015-07-25 ENCOUNTER — Ambulatory Visit (INDEPENDENT_AMBULATORY_CARE_PROVIDER_SITE_OTHER): Payer: Medicare Other | Admitting: *Deleted

## 2015-07-25 ENCOUNTER — Encounter: Payer: Self-pay | Admitting: Cardiology

## 2015-07-25 DIAGNOSIS — Z950 Presence of cardiac pacemaker: Secondary | ICD-10-CM

## 2015-07-25 DIAGNOSIS — R001 Bradycardia, unspecified: Secondary | ICD-10-CM

## 2015-07-25 DIAGNOSIS — I442 Atrioventricular block, complete: Secondary | ICD-10-CM

## 2015-07-25 DIAGNOSIS — I459 Conduction disorder, unspecified: Secondary | ICD-10-CM

## 2015-07-25 LAB — CUP PACEART INCLINIC DEVICE CHECK
Battery Voltage: 3.1 V
Brady Statistic AP VP Percent: 0.05 %
Brady Statistic AS VP Percent: 0.28 %
Brady Statistic AS VS Percent: 99.61 %
Implantable Lead Implant Date: 20161118
Implantable Lead Location: 753859
Implantable Lead Model: 5076
Lead Channel Impedance Value: 361 Ohm
Lead Channel Impedance Value: 456 Ohm
Lead Channel Impedance Value: 570 Ohm
Lead Channel Pacing Threshold Amplitude: 0.75 V
Lead Channel Pacing Threshold Pulse Width: 0.4 ms
Lead Channel Sensing Intrinsic Amplitude: 2.75 mV
MDC IDC LEAD IMPLANT DT: 20161118
MDC IDC LEAD LOCATION: 753860
MDC IDC MSMT BATTERY REMAINING LONGEVITY: 162 mo
MDC IDC MSMT LEADCHNL RA IMPEDANCE VALUE: 513 Ohm
MDC IDC MSMT LEADCHNL RA PACING THRESHOLD AMPLITUDE: 1 V
MDC IDC MSMT LEADCHNL RA PACING THRESHOLD PULSEWIDTH: 0.4 ms
MDC IDC MSMT LEADCHNL RV SENSING INTR AMPL: 5.75 mV
MDC IDC SESS DTM: 20170120114812
MDC IDC SET LEADCHNL RA PACING AMPLITUDE: 3.5 V
MDC IDC SET LEADCHNL RV PACING AMPLITUDE: 3.5 V
MDC IDC SET LEADCHNL RV PACING PULSEWIDTH: 0.4 ms
MDC IDC SET LEADCHNL RV SENSING SENSITIVITY: 0.9 mV
MDC IDC STAT BRADY AP VS PERCENT: 0.05 %
MDC IDC STAT BRADY RA PERCENT PACED: 0.11 %
MDC IDC STAT BRADY RV PERCENT PACED: 0.34 %

## 2015-07-25 NOTE — Progress Notes (Signed)
Wound check appointment. Steri-strips removed. Wound without redness or edema. Incision edges approximated, wound well healed. Normal device function. Thresholds, sensing, and impedances consistent with implant measurements. Device programmed at 3.5V/auto capture programmed on for extra safety margin until 3 month visit. Histogram distribution appropriate for patient and level of activity. No mode switches or high ventricular rates noted. Patient educated about wound care, arm mobility, lifting restrictions. ROV with WC in 3 months. 

## 2015-07-30 ENCOUNTER — Ambulatory Visit: Payer: Medicare Other

## 2015-08-06 ENCOUNTER — Telehealth: Payer: Self-pay | Admitting: Internal Medicine

## 2015-08-06 NOTE — Telephone Encounter (Signed)
Called patient: device check on 07/25/15 shows 0.4% pacing with appropriate heart rate histograms. Fatigue is likely not going to be improved with any pacemaker adjustments. Pt reports that she is now going to take a multivitamin and an increased dose of vitamin d per her primary care physician. She is c/o occasional sharp pains near her device- no aching or swelling noted.   Returned call to [place] at [place]- she left the office for the day. Will have our medical records dept take care of faxing the last device check after Dr. Curt Bears has signed off. Left message for her to call back if needed.

## 2015-08-06 NOTE — Telephone Encounter (Signed)
New message      Calling to let the doctor know that the last 2-3 visits at their office, patient complained of fatigue and decreased energy.  They did not know if her pacemaker needed to be adjusted.  Please call patient and follow up and fax them the note at [phone] attn Rico Junker, PA.

## 2015-08-08 NOTE — Telephone Encounter (Signed)
[name] returned call.  She states they received the device report from 07/25/15.  [name] that patient's heart rate histograms are appropriate and that the patient's fatigue is not likely going to be improved with any pacemaker adjustments.  She verbalizes understanding and denies questions or concerns.

## 2015-08-25 ENCOUNTER — Encounter: Payer: Medicare Other | Admitting: Cardiology

## 2015-09-02 NOTE — Progress Notes (Signed)
Cardiology Office Note:    Date:  09/03/2015   ID:  [name], DOB 21-Dec-1945, MRN [id number]  PCP:  Diana Noon, MD  Cardiologist:  Dr. Shelva Majestic  >> patient requests Dr. Dorris Carnes  Electrophysiologist:  Dr. Allegra Lai   Chief Complaint  Patient presents with  . Shortness of Breath    History of Present Illness:     [name] is a 70 y.o. 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   Admitted in 10/16 with unexplained syncope. Echocardiogram demonstrated normal LV function. Outpatient event monitor was arranged. Event monitor demonstrated episodes of complete heart block that was symptomatic. She was admitted to the hospital and underwent implantation of dual-chamber pacemaker in 11/16. Post implant, the patient complained of chest pain that improved with changing her base rate from 50-60. Patient continued to have pain and pocket revision was arranged. Lead revision was performed 07/10/15. Discharge notes indicate pain was improved post procedure.   She returns today for further evaluation of dyspnea, dizziness and chest discomfort. She tells me that she has felt bad since her pacemaker was implanted. She has not been as active since implantation. She has multiple symptoms. She awakens in the morning and feels out of breath. This is usually her worst time of the day. She now sleeps on 2 pillows. She has discomfort in her chest that she feels in her back. She has to sit up on the edge of the bed to feel better. She has discomfort in her chest with and without activity. She points to the area around her pacemaker. She notes dyspnea with minimal activities. She denies PND or significant edema. She has gained 7 pounds since December. She denies increased abdominal girth. She denies coughing or  wheezing. She feels lightheaded with any type of activity. She has had episodes of near syncope. She denies recurrent syncope. She denies spinning sensation. Her primary care physician has given her meclizine. This seems to help some of her dizziness. Primary care saw her 08/05/15. Labs at that visit: Hemoglobin 14.2, creatinine 0.68, BUN 14, potassium 4.7, ALT 10, TSH 0.897, urine culture negative.   Past Medical History  Diagnosis Date  . Hyperlipidemia   . 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 05/24/2015    MDT PPM implant 05/24/15 Dr. Curt Bears, lead revision 07/10/15  . Hx of syncope 05/24/2015  . Symptomatic bradycardia 05/24/2015  . Heart murmur   . 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 Heart block 05/23/2015  . Anxiety     Past Surgical History  Procedure Laterality Date  . Transthoracic echocardiogram  11/12/2010    EF=>55%, moderate calcification of aortic valve leaflets, no MVP seen, normal LV thickness   . Nm myocar perf wall motion  11/03/2009    protocol: Persantine, no evidence of ischemia/infarct, post EF67%  . Cardiac catheterization  02/22/2008    no significant CAD, 50% narrowing of proximal mid-dom. right Coronary artery. medical therapy along with antiplatlet therapy   . Ep implantable device N/A 05/23/2015    Procedure: Pacemaker Implant;  Surgeon: Diana Meredith Leeds, MD;  Location: [facility];  Service: Cardiovascular;  Laterality: N/A;  . Ep implantable device N/A 07/10/2015    Procedure: Lead Revision Pacemaker;  Surgeon: Diana Meredith Leeds, MD;  Location: [facility];  Service:  Cardiovascular;  Laterality: N/A;  . Back surgery    . Abdominal hysterectomy    . Tonsillectomy    . Appendectomy      Current Medications: Outpatient Prescriptions Prior to Visit  Medication Sig Dispense Refill  . aspirin 81 MG EC tablet Take 81 mg by mouth every other day. Swallow whole.    .  Calcium-Magnesium-Zinc 167-83-8 MG TABS Take 1 tablet by mouth daily.    . clonazePAM (KLONOPIN) 0.5 MG tablet Take 0.5 mg by mouth daily.    Marland Kitchen estrogens, conjugated, (PREMARIN) 1.25 MG tablet Take 1.25 mg by mouth daily.    . meclizine (ANTIVERT) 25 MG tablet Take 25 mg by mouth daily as needed for dizziness.     . meloxicam (MOBIC) 15 MG tablet Take 15 mg by mouth daily.    . Multiple Vitamins-Minerals (HAIR/SKIN/NAILS) TABS Take 1 tablet by mouth daily.    Vladimir Faster Glycol-Propyl Glycol (SYSTANE OP) Place 1 drop into both eyes daily.    . Vitamin D, Cholecalciferol, 400 UNITS CAPS Take 400 Units by mouth 2 (two) times daily. TAKE 2 SOFTGEL CAPSULES 400 UNITS BY MOUTH TWO TIMES DAILY (800 UNITS TOTAL)     No facility-administered medications prior to visit.     Allergies:   Sulfa antibiotics   Social History   Social History  . Marital Status: Widowed    Spouse Name: N/A  . Number of Children: N/A  . Years of Education: N/A   Social History Main Topics  . Smoking status: Former Smoker -- 1.00 packs/day for 30 years    Types: Cigarettes  . Smokeless tobacco: Never Used     Comment: QUIT SMOKING IN 2013  . Alcohol Use: Yes     Comment: 1 glass per week  . Drug Use: No  . Sexual Activity: Not Asked   Other Topics Concern  . None   Social History Narrative     Family History:  The patient's family history includes Cancer in her mother; Diabetes in her brother, brother, maternal grandfather, and maternal grandmother; Heart disease in her brother, maternal grandfather, and maternal grandmother; Hyperlipidemia in her brother, brother, and son; Hypertension in her sister; Stroke in her paternal grandfather; Tuberculosis in her paternal grandmother.   ROS:   Please see the history of present illness.    Review of Systems  Constitution: Positive for diaphoresis and malaise/fatigue.  HENT: Positive for headaches.   Cardiovascular: Positive for chest pain.  Respiratory: Positive  for shortness of breath.   Neurological: Positive for dizziness and loss of balance.  Psychiatric/Behavioral: The patient is nervous/anxious.   All other systems reviewed and are negative.   Physical Exam:    VS:  BP 112/64 mmHg  Pulse 66  Ht 5' 5.5" (1.664 m)  Wt 181 lb (82.101 kg)  BMI 29.65 kg/m2  SpO2 96%    Orthostatic VS for the past 24 hrs:  BP- Lying Pulse- Lying BP- Sitting Pulse- Sitting BP- Standing at 0 minutes Pulse- Standing at 0 minutes  09/03/15 1525 112/67 mmHg 71 104/68 mmHg 72 118/68 mmHg 84   GEN: Well nourished, well developed, in no acute distress HEENT: normal Neck: no JVD, no masses Cardiac: Normal S1/S2, RRR; no murmurs, rubs, or gallops, no edema;  Chest: Pacer site benign without erythema or swelling; patient is tender to light palpation    Respiratory:  clear to auscultation bilaterally; no wheezing, rhonchi or rales GI: soft, nontender  MS: no deformity or atrophy Skin: warm and  dry  Neuro:  no focal deficits  Psych: Alert and oriented x 3, normal affect  Wt Readings from Last 3 Encounters:  09/03/15 181 lb (82.101 kg)  07/11/15 173 lb 8 oz (78.699 kg)  06/19/15 174 lb 3.2 oz (79.017 kg)      Studies/Labs Reviewed:     EKG:  EKG is  ordered today.  The ekg ordered today demonstrates NSR, HR 67, LAD, RBBB, QTC 433 ms, no change from prior tracing  Recent Labs: 04/30/2015: ALT 14 07/08/2015: BUN 12; Creat 0.66; Hemoglobin 14.1; Platelets 233; Potassium 4.1; Sodium 139   Recent Lipid Panel    Component Value Date/Time   CHOL 183 01/14/2014 0928   TRIG 118 01/14/2014 0928   HDL 65 01/14/2014 0928   CHOLHDL 2.8 01/14/2014 0928   VLDL 24 01/14/2014 0928   LDLCALC 94 01/14/2014 0928    Pacer Interrogation 09/03/2015 Patient is in normal sinus rhythm 70+ percent of the time, she is V paced 25% of the time. She had an episode of SVT 7 seconds 2/26 and an episode of NSVT 13 seconds 2/9.   Additional studies/ records that were reviewed  today include:  PPM Lead Revision 1/17 CONCLUSIONS:  1. Successful lead revision of a Medtronic Advisa L dual-chamber pacemaker for symptomatic bradycardia 2. No early apparent complications.  PPM Implant 11/16 CONCLUSIONS:  1. Successful implantation of a Medtronic Advisa L dual-chamber pacemaker for symptomatic bradycardia 2. No early apparent complications.   Echo 10/16 EF 60-65%, normal wall motion, grade 1 diastolic dysfunction, aortic valve cusp separation reduced, no stenosis, mean gradient 9 mmHg, MAC, mild MR, mild LAE  Carotid US 7/15 +plaque, no significant ICA stenosis  Myoview 5/11 No ischemia or scar, breast attenuation, EF 67%, low risk  LHC 8/09 EF 60% LM normal LAD mid myocardial bridge LCx proximal 20% RCA mid 50%   ASSESSMENT:     1. Other chest pain   2. Shortness of breath   3. Dizziness   4. Coronary artery disease involving native coronary artery of native heart without angina pectoris   5. NSVT (nonsustained ventricular tachycardia) (HCC)   6. S/P cardiac pacemaker procedure     PLAN:     In order of problems listed above:  1. Chest pain - She has atypical and typical features. She had minimal CAD at cardiac catheterization in 2009. She's had continuous symptoms since her pacemaker was implanted. She has significant dyspnea as well as dizziness. She did have a trip to [place] on a bus prior to her syncopal spell in October. She's had 2 admissions to the hospital for pacemaker implantations. With normal oxygen saturation and lack of tachycardia, pulmonary embolism is doubtful. However, I believe that this needs to be ruled out. It has been 7 years since her heart catheterization. He has been 5-6 years since her last stress test.  -  Arrange Lexiscan Myoview  -  Obtain DDimer >> she Diana need CTA if +  -  FU with Dr. Dorris Carnes next week as planned.   2. SOB - Suspect deconditioning.  She is V paced 25% of the time.  No pacer adjustments need to  be made.  She does not look volume overloaded.  She does have mild diastolic dysfunction.    -  Get Myoview as noted.  -  Check BNP >> Add Lasix if elevated.  3. Dizziness - Check orthostatic VS today >> no significant BP drop or HR increase.  Question atypical symptoms  of BPPV with improvement on Meclizine.  If cardiac workup ok, consider referral to PT for Vestib Rehab.  4. CAD - Mild non-obs CAD by prior cath.  Continue ASA.  With chest pain, get Nuc stress test.  5. NSVT - Noted on PPM interrogation today.  Also had brief SVT.  Echo in 10/16 with normal EF. Obtain Nuc stress test as noted.  If workup ok, consider Event monitor.  6. S/p Pacemaker - FU with EP as planned.    45 minutes spent with patient today, evaluating her symptoms and coordinating care.  Face to face time > 50%.   Medication Adjustments/Labs and Tests Ordered: Current medicines are reviewed at length with the patient today.  Concerns regarding medicines are outlined above.  Medication changes, Labs and Tests ordered today are outlined in the Patient Instructions noted below. Patient Instructions  Medication Instructions:  Your physician recommends that you continue on your current medications as directed. Please refer to the Current Medication list given to you today.  Labwork: TODAY BMET, D-DIMER  Testing/Procedures: 1. Your physician has requested that you have a lexiscan myoview. For further information please visit [email or web address]. Please follow instruction sheet, as given.  Follow-Up: 1. KEEP YOUR APPT WITH DR. ROSS ON 09/12/15 2. KEEP YOUR APPT WITH DR. Curt Bears ON 10/15/15  Any Other Special Instructions Diana Be Listed Below (If Applicable).  If you need a refill on your cardiac medications before your next appointment, please call your pharmacy.   Signed, Richardson Dopp, PA-C  09/03/2015 4:50 PM    [facility] [address], [place], [place]  [address] Phone: [phone];  Fax: [phone]

## 2015-09-03 ENCOUNTER — Telehealth: Payer: Self-pay | Admitting: *Deleted

## 2015-09-03 ENCOUNTER — Encounter: Payer: Self-pay | Admitting: Cardiology

## 2015-09-03 ENCOUNTER — Ambulatory Visit (INDEPENDENT_AMBULATORY_CARE_PROVIDER_SITE_OTHER): Payer: Medicare Other | Admitting: *Deleted

## 2015-09-03 ENCOUNTER — Ambulatory Visit (INDEPENDENT_AMBULATORY_CARE_PROVIDER_SITE_OTHER): Payer: Medicare Other | Admitting: Physician Assistant

## 2015-09-03 ENCOUNTER — Encounter: Payer: Self-pay | Admitting: Physician Assistant

## 2015-09-03 VITALS — BP 112/64 | HR 66 | Ht 65.5 in | Wt 181.0 lb

## 2015-09-03 DIAGNOSIS — Z950 Presence of cardiac pacemaker: Secondary | ICD-10-CM

## 2015-09-03 DIAGNOSIS — I2510 Atherosclerotic heart disease of native coronary artery without angina pectoris: Secondary | ICD-10-CM

## 2015-09-03 DIAGNOSIS — R0789 Other chest pain: Secondary | ICD-10-CM

## 2015-09-03 DIAGNOSIS — R42 Dizziness and giddiness: Secondary | ICD-10-CM

## 2015-09-03 DIAGNOSIS — R0602 Shortness of breath: Secondary | ICD-10-CM

## 2015-09-03 DIAGNOSIS — I472 Ventricular tachycardia: Secondary | ICD-10-CM

## 2015-09-03 DIAGNOSIS — I4729 Other ventricular tachycardia: Secondary | ICD-10-CM

## 2015-09-03 LAB — CUP PACEART INCLINIC DEVICE CHECK
Brady Statistic AP VS Percent: 0.05 %
Brady Statistic AS VS Percent: 75.21 %
Brady Statistic RV Percent Paced: 24.74 %
Implantable Lead Implant Date: 20161118
Implantable Lead Location: 753859
Lead Channel Impedance Value: 494 Ohm
Lead Channel Pacing Threshold Amplitude: 1 V
Lead Channel Pacing Threshold Pulse Width: 0.4 ms
Lead Channel Sensing Intrinsic Amplitude: 6.25 mV
Lead Channel Setting Pacing Amplitude: 2 V
Lead Channel Setting Pacing Amplitude: 2.5 V
Lead Channel Setting Pacing Pulse Width: 0.4 ms
MDC IDC LEAD IMPLANT DT: 20161118
MDC IDC LEAD LOCATION: 753860
MDC IDC MSMT BATTERY REMAINING LONGEVITY: 130 mo
MDC IDC MSMT BATTERY VOLTAGE: 3.06 V
MDC IDC MSMT LEADCHNL RA IMPEDANCE VALUE: 342 Ohm
MDC IDC MSMT LEADCHNL RA PACING THRESHOLD AMPLITUDE: 1 V
MDC IDC MSMT LEADCHNL RA PACING THRESHOLD PULSEWIDTH: 0.4 ms
MDC IDC MSMT LEADCHNL RA SENSING INTR AMPL: 3 mV
MDC IDC MSMT LEADCHNL RV IMPEDANCE VALUE: 437 Ohm
MDC IDC MSMT LEADCHNL RV IMPEDANCE VALUE: 608 Ohm
MDC IDC SESS DTM: 20170301155234
MDC IDC SET LEADCHNL RV SENSING SENSITIVITY: 0.9 mV
MDC IDC STAT BRADY AP VP PERCENT: 0.06 %
MDC IDC STAT BRADY AS VP PERCENT: 24.68 %
MDC IDC STAT BRADY RA PERCENT PACED: 0.11 %

## 2015-09-03 LAB — D-DIMER, QUANTITATIVE (NOT AT ARMC): D DIMER QUANT: 0.27 ug{FEU}/mL (ref 0.00–0.50)

## 2015-09-03 LAB — BRAIN NATRIURETIC PEPTIDE: B NATRIURETIC PEPTIDE 5: 13.5 pg/mL (ref 0.0–100.0)

## 2015-09-03 NOTE — Patient Instructions (Addendum)
Medication Instructions:  Your physician recommends that you continue on your current medications as directed. Please refer to the Current Medication list given to you today.  Labwork: TODAY BMET, D-DIMER  Testing/Procedures: 1. Your physician has requested that you have a lexiscan myoview. For further information please visit [email or web address]. Please follow instruction sheet, as given.  Follow-Up: 1. KEEP YOUR APPT WITH DR. ROSS ON 09/12/15 2. KEEP YOUR APPT WITH DR. Curt Bears ON 10/15/15  Any Other Special Instructions Will Be Listed Below (If Applicable).  If you need a refill on your cardiac medications before your next appointment, please call your pharmacy.

## 2015-09-03 NOTE — Telephone Encounter (Signed)
Pt notified of normal BNP and normal D-DIMER. pt said thank you.

## 2015-09-03 NOTE — Progress Notes (Signed)
Pacemaker check in clinic- add on for SOB/fatigue while seeing [name], [place]. Normal device function. Thresholds, sensing, impedances consistent with previous measurements. Device programmed to maximize longevity. 1 SVT episode- 7 seconds at 170bpm. 1 VT episode lasting 13 seconds at 200bpm. Device programmed at appropriate safety margins. Histogram distribution appropriate for patient activity level. Device programmed to optimize intrinsic conduction. Estimated longevity 9-12 years. Patient enrolled in remote follow-up. ROV with WC 10-15-15.

## 2015-09-04 ENCOUNTER — Telehealth (HOSPITAL_COMMUNITY): Payer: Self-pay | Admitting: *Deleted

## 2015-09-04 NOTE — Telephone Encounter (Signed)
Patient given detailed instructions per Myocardial Perfusion Study Information Sheet for the test on 09/09/15. Patient notified to arrive 15 minutes early and that it is imperative to arrive on time for appointment to keep from having the test rescheduled.  If you need to cancel or reschedule your appointment, please call the office within 24 hours of your appointment. Failure to do so may result in a cancellation of your appointment, and a $50 no show fee. Patient verbalized understanding.Francyne Arreaga J Kindel Rochefort,RN  

## 2015-09-09 ENCOUNTER — Ambulatory Visit (HOSPITAL_COMMUNITY): Payer: Medicare Other | Attending: Cardiology

## 2015-09-09 DIAGNOSIS — R0789 Other chest pain: Secondary | ICD-10-CM | POA: Diagnosis present

## 2015-09-09 DIAGNOSIS — I2510 Atherosclerotic heart disease of native coronary artery without angina pectoris: Secondary | ICD-10-CM | POA: Diagnosis not present

## 2015-09-09 DIAGNOSIS — R0602 Shortness of breath: Secondary | ICD-10-CM | POA: Diagnosis not present

## 2015-09-09 DIAGNOSIS — I4729 Other ventricular tachycardia: Secondary | ICD-10-CM

## 2015-09-09 DIAGNOSIS — I4510 Unspecified right bundle-branch block: Secondary | ICD-10-CM | POA: Insufficient documentation

## 2015-09-09 DIAGNOSIS — R42 Dizziness and giddiness: Secondary | ICD-10-CM | POA: Insufficient documentation

## 2015-09-09 DIAGNOSIS — I472 Ventricular tachycardia: Secondary | ICD-10-CM | POA: Diagnosis not present

## 2015-09-09 DIAGNOSIS — Z950 Presence of cardiac pacemaker: Secondary | ICD-10-CM | POA: Insufficient documentation

## 2015-09-09 LAB — MYOCARDIAL PERFUSION IMAGING
CHL CUP NUCLEAR SRS: 2
CHL CUP RESTING HR STRESS: 57 {beats}/min
LV dias vol: 80 mL
LV sys vol: 23 mL
NUC STRESS TID: 0.95
Peak HR: 83 {beats}/min
RATE: 0.36
SDS: 3
SSS: 5

## 2015-09-09 MED ORDER — TECHNETIUM TC 99M SESTAMIBI GENERIC - CARDIOLITE
32.7000 | Freq: Once | INTRAVENOUS | Status: AC | PRN
  Administered 2015-09-09: 32.7 via INTRAVENOUS

## 2015-09-09 MED ORDER — TECHNETIUM TC 99M SESTAMIBI GENERIC - CARDIOLITE
10.7000 | Freq: Once | INTRAVENOUS | Status: AC | PRN
  Administered 2015-09-09: 11 via INTRAVENOUS

## 2015-09-09 MED ORDER — REGADENOSON 0.4 MG/5ML IV SOLN
0.4000 mg | Freq: Once | INTRAVENOUS | Status: AC
  Administered 2015-09-09: 0.4 mg via INTRAVENOUS

## 2015-09-10 ENCOUNTER — Encounter: Payer: Self-pay | Admitting: Physician Assistant

## 2015-09-10 ENCOUNTER — Telehealth: Payer: Self-pay | Admitting: *Deleted

## 2015-09-10 NOTE — Telephone Encounter (Signed)
Pt notified of myoview results by phone with verbal understanding 

## 2015-09-12 ENCOUNTER — Encounter: Payer: Self-pay | Admitting: Internal Medicine

## 2015-09-12 ENCOUNTER — Ambulatory Visit (INDEPENDENT_AMBULATORY_CARE_PROVIDER_SITE_OTHER): Payer: Medicare Other | Admitting: Internal Medicine

## 2015-09-12 VITALS — BP 124/72 | HR 76 | Ht 65.5 in | Wt 182.8 lb

## 2015-09-12 DIAGNOSIS — Z9189 Other specified personal risk factors, not elsewhere classified: Secondary | ICD-10-CM | POA: Diagnosis not present

## 2015-09-12 DIAGNOSIS — Z87898 Personal history of other specified conditions: Secondary | ICD-10-CM

## 2015-09-12 NOTE — Progress Notes (Signed)
Cardiology Office Note   Date:  09/12/2015   ID:  [name], DOB [birth year], MRN [id number]  PCP:  Chesley Noon, MD  Cardiologist:   Dorris Carnes, MD   No chief complaint on file.  F/U of dizziness.     History of Present Illness: [name] is a 70 y.o. female with a history of mild nonobstructive CAD (cath 2009), syncope  She was admitted in October 2016.  Ehoc LVEF normal  Event monitor as outpt showed intermitt CHB  Admtted and underwent PPM in NOvember.  Post implan had CP Pocket revision arranged with lead revision  Done 07/10/15  Pt says that symptoms improved for a short time.   Last seen by [name] on 3/1  Complained of continued dyspnea with exertion.  Dizzy  No syncope  Has not felt good  Still getting chst pain  Less than last fall.  Cut way back on actvity   S Weaver set up a [employer] which was normal   BNP and D Dimer normal     Outpatient Prescriptions Prior to Visit  Medication Sig Dispense Refill  . aspirin 81 MG EC tablet Take 81 mg by mouth every other day. Swallow whole.    . Calcium-Magnesium-Zinc 167-83-8 MG TABS Take 1 tablet by mouth daily.    . clonazePAM (KLONOPIN) 0.5 MG tablet Take 0.5 mg by mouth daily.    Marland Kitchen estrogens, conjugated, (PREMARIN) 1.25 MG tablet Take 1.25 mg by mouth daily.    . meclizine (ANTIVERT) 25 MG tablet Take 25 mg by mouth daily as needed for dizziness.     . meloxicam (MOBIC) 15 MG tablet Take 15 mg by mouth daily.    . Multiple Vitamins-Minerals (HAIR/SKIN/NAILS) TABS Take 1 tablet by mouth daily.    Vladimir Faster Glycol-Propyl Glycol (SYSTANE OP) Place 1 drop into both eyes daily.    . Vitamin D, Cholecalciferol, 400 UNITS CAPS Take 400 Units by mouth 2 (two) times daily. TAKE 2 SOFTGEL CAPSULES 400 UNITS BY MOUTH TWO TIMES DAILY (800 UNITS TOTAL)     No facility-administered medications prior to visit.     Allergies:   Sulfa antibiotics   Past Medical History  Diagnosis Date  . Hyperlipidemia   . S/P  cardiac pacemaker procedure, 05/23/15, MDT Advisa placed. 05/24/2015    MDT PPM implant 05/24/15 Dr. Curt Bears, lead revision 07/10/15  . Hx of syncope 05/24/2015  . History of echocardiogram     a. Ech 10/16: EF 60-65%, normal wall motion, grade 1 diastolic dysfunction, aortic valve cusp separation reduced, no stenosis, mean gradient 9 mmHg, MAC, mild MR, mild LAE  . Right bundle branch block (RBBB) plus left anterior (Diana) hemiblock 03/15/2015  . Complete heart block ([place]) 05/23/2015    s/p Pacemaker 11/16  . Anxiety   . CAD (coronary artery disease)     a. LHC 8/09 - mLAD myocardial bridge, pLCx 52, mRCA 19  //  b. Myoview 5/11 - No ischemia or scar, breast attenuation, EF 67%, low risk  //  c. Lexiscan Myoview 3/17: EF 71%, no ischemia, low risk  . Carotid stenosis     a. Carotid US 7/15 - +plaque, no significant ICA stenosis    Past Surgical History  Procedure Laterality Date  . Transthoracic echocardiogram  11/12/2010    EF=>55%, moderate calcification of aortic valve leaflets, no MVP seen, normal LV thickness   . Nm myocar perf wall motion  11/03/2009    protocol: Persantine, no  evidence of ischemia/infarct, post EF67%  . Cardiac catheterization  02/22/2008    no significant CAD, 50% narrowing of proximal mid-dom. right Coronary artery. medical therapy along with antiplatlet therapy   . Ep implantable device N/A 05/23/2015    Procedure: Pacemaker Implant;  Surgeon: Will Meredith Leeds, MD;  Location: [facility];  Service: Cardiovascular;  Laterality: N/A;  . Ep implantable device N/A 07/10/2015    Procedure: Lead Revision Pacemaker;  Surgeon: Will Meredith Leeds, MD;  Location: [facility];  Service: Cardiovascular;  Laterality: N/A;  . Back surgery    . Abdominal hysterectomy    . Tonsillectomy    . Appendectomy       Social History:  The patient  reports that she has quit smoking. Her smoking use included Cigarettes. She has a 30 pack-year smoking history. She has  never used smokeless tobacco. She reports that she drinks alcohol. She reports that she does not use illicit drugs.   Family History:  The patient's family history includes Cancer in her mother; Diabetes in her brother, brother, maternal grandfather, and maternal grandmother; Heart disease in her brother, maternal grandfather, and maternal grandmother; Hyperlipidemia in her brother, brother, and son; Hypertension in her sister; Stroke in her paternal grandfather; Tuberculosis in her paternal grandmother.    ROS:  Please see the history of present illness. All other systems are reviewed and  Negative to the above problem except as noted.    PHYSICAL EXAM: VS:  BP 124/72 mmHg  Pulse 76  Ht 5' 5.5" (1.664 m)  Wt 182 lb 12.8 oz (82.918 kg)  BMI 29.95 kg/m2  GEN: Well nourished, well developed, in no acute distress HEENT: normal Neck: no JVD, carotid bruits, or masses Cardiac: RRR; no murmurs, rubs, or gallops,no edema  Respiratory:  clear to auscultation bilaterally, normal work of breathing GI: soft, nontender, nondistended, + BS  No hepatomegaly  MS: no deformity Moving all extremities   Skin: warm and dry, no rash Neuro:  Strength and sensation are intact Psych: euthymic mood, full affect   EKG:  EKG is ordered today.  SR 72 bpm  RBBB  LAFB     Lipid Panel    Component Value Date/Time   CHOL 183 01/14/2014 0928   TRIG 118 01/14/2014 0928   HDL 65 01/14/2014 0928   CHOLHDL 2.8 01/14/2014 0928   VLDL 24 01/14/2014 0928   LDLCALC 94 01/14/2014 0928      Wt Readings from Last 3 Encounters:  09/12/15 182 lb 12.8 oz (82.918 kg)  09/09/15 181 lb (82.101 kg)  09/03/15 181 lb (82.101 kg)      ASSESSMENT AND PLAN:  1  Dyspnea  / CP  I am not sure what causing  Will review with EP service.  ? If device can be modified   LVEF on nuclear scan was normal  I would hold off on further testing    2.  Dizziness Again, not clear  Cause  Orthostatics today are negative  Encuraged her  to stay hydrated  Can add a little salt to regimen  3  Chest prssure  Myoview normal  4.  Lipds  WIll need to be addressed on next visit      Signed, Dorris Carnes, MD  09/12/2015 12:31 PM    [name], [place], [place]  [address] Phone: [phone]; Fax: [phone]

## 2015-09-12 NOTE — Patient Instructions (Signed)
Your physician recommends that you continue on your current medications as directed. Please refer to the Current Medication list given to you today. Dr. Harrington Challenger will call you with update/recommendations after speaking with the electrophysiologists (EP)

## 2015-09-18 ENCOUNTER — Telehealth: Payer: Self-pay | Admitting: Internal Medicine

## 2015-09-18 NOTE — Telephone Encounter (Signed)
New message      Pt states she is waiting to hear from Dr Harrington Challenger regarding her sob.  She saw Dr Harrington Challenger on 09-12-15 and she was going to talk to Dr Curt Bears and call her back.  She has not heard from Dr Harrington Challenger and is still having sob.  Please call

## 2015-09-19 NOTE — Telephone Encounter (Signed)
Received staff message from Chanetta Marshall, NP to have patient send a remote transmission and she will review.   Spoke with patient.  She has not ever sent a remote transmission yet.  Discussed with Tobin Chad in device clinic and transferred patient to her.  Pt continues to experience SOB, chest pain and is unable to rest. This is not new, but she feels it is worse since yesterday and considered going to the ED overnight.

## 2015-09-19 NOTE — Telephone Encounter (Signed)
Verbalized instructions on how to send remote transmission. Patient voiced understanding.   Will call patient after transmission is reviewed.

## 2015-09-19 NOTE — Telephone Encounter (Signed)
Appt made for 09/22/15 @ 1145. Patient aware.

## 2015-09-19 NOTE — Telephone Encounter (Signed)
See below

## 2015-09-19 NOTE — Telephone Encounter (Signed)
Follow up      Pt states she is having trouble with her pacemaker.  She is having sob and chest pain.  No one has called her back.  Patient request to talk to someone now.

## 2015-09-19 NOTE — Telephone Encounter (Signed)
Spoke with patient about sx's. Patient states that she has been experiencing a dull ache in her chest x 10 weeks. She said that the only time that she felt relief was right after her lead revision in January. Patient also states that she has been dyspneic and fatigued all the time.    Patient is concerned that her ppm is "just not agreeing with her", because she says that all of her other tests have been normal.  Remote transmission from today shows that patient has been in SR 99.9% since her last device check on 3/1. No episodes recorded. Stable lead measurements.   I explained to her that Dr.Camnitz will be in the office on Monday and I will discuss her sx's and concerns with him then. I advised patient to go to the ER via ambulance if she notices any worsening or increase in sx's over the weekend. Patient voiced understanding and appreciation of the call.

## 2015-09-19 NOTE — Telephone Encounter (Signed)
Please add [name] on to Dr Curt Bears' schedule on Monday for evaluation.

## 2015-09-21 NOTE — Progress Notes (Signed)
Electrophysiology Office Note   Date:  09/22/2015   ID:  [name], DOB 1946/02/12, MRN [id number]  PCP:  Chesley Noon, MD  Cardiologist:  Harrington Challenger Primary Electrophysiologist:  See Beharry Meredith Leeds, MD    Chief Complaint  Patient presents with  . Pacemaker Check  . Shortness of Breath     History of Present Illness: [name] is a 70 y.o. female who presents today for electrophysiology evaluation.   She has a history of mild nonobstructive CAD (cath 2009), syncope She was admitted in October 2016. Echo LVEF normal Event monitor as outpt showed intermittent CHB. Admtted and underwent PPM in November. Post implant had CP Pocket revision arranged with lead revisiondone 07/10/15. Pt says that symptoms improved for a few hours after the lead was revised. She continues to have chest pain wh over her left breast. She says that shortness of breath that keeps her  From doing many of her daily activities.   Today, she denies symptoms of palpitations, chest pain, shortness of breath, orthopnea, PND, lower extremity edema, claudication, dizziness, presyncope, syncope, bleeding, or neurologic sequela. The patient is tolerating medications without difficulties and is otherwise without complaint today.    Past Medical History  Diagnosis Date  . Hyperlipidemia   . 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 05/24/2015    MDT PPM implant 05/24/15 Dr. Curt Bears, lead revision 07/10/15  . Hx of syncope 05/24/2015  . History of echocardiogram     a. Ech 10/16: EF 60-65%, normal wall motion, grade 1 diastolic dysfunction, aortic valve cusp separation reduced, no stenosis, mean gradient 9 mmHg, MAC, mild MR, mild LAE  . 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 Anxiety   . CAD (coronary artery disease)     a. LHC 8/09 - mLAD myocardial bridge, pLCx 26, mRCA 85  //  b.  Myoview 5/11 - No ischemia or scar, breast attenuation, EF 67%, low risk  //  c. Lexiscan Myoview 3/17: EF 71%, no ischemia, low risk  . Carotid stenosis     a. Carotid US 7/15 - +plaque, no significant ICA stenosis   Past Surgical History  Procedure Laterality Date  . Transthoracic echocardiogram  11/12/2010    EF=>55%, moderate calcification of aortic valve leaflets, no MVP seen, normal LV thickness   . Nm myocar perf wall motion  11/03/2009    protocol: Persantine, no evidence of ischemia/infarct, post EF67%  . Cardiac catheterization  02/22/2008    no significant CAD, 50% narrowing of proximal mid-dom. right Coronary artery. medical therapy along with antiplatlet therapy   . Ep implantable device N/A 05/23/2015    Procedure: Pacemaker Implant;  Surgeon: Mabeline Varas Meredith Leeds, MD;  Location: [facility];  Service: Cardiovascular;  Laterality: N/A;  . Ep implantable device N/A 07/10/2015    Procedure: Lead Revision Pacemaker;  Surgeon: Aloria Looper Meredith Leeds, MD;  Location: [facility];  Service: Cardiovascular;  Laterality: N/A;  . Back surgery    . Abdominal hysterectomy    . Tonsillectomy    . Appendectomy       Current Outpatient Prescriptions  Medication Sig Dispense Refill  . aspirin 81 MG EC tablet Take 81 mg by mouth every other day. Swallow whole.    . Calcium-Magnesium-Zinc 167-83-8 MG TABS Take 1 tablet by mouth daily.    . cholecalciferol (VITAMIN D) 1000 units tablet  Take 1,000 Units by mouth daily.    . clonazePAM (KLONOPIN) 0.5 MG tablet Take 0.5 mg by mouth daily.    Marland Kitchen estrogens, conjugated, (PREMARIN) 1.25 MG tablet Take 1.25 mg by mouth daily.    . meclizine (ANTIVERT) 25 MG tablet Take 25 mg by mouth daily as needed for dizziness.     . meloxicam (MOBIC) 15 MG tablet Take 15 mg by mouth daily.    . Multiple Vitamins-Minerals (HAIR/SKIN/NAILS) TABS Take 1 tablet by mouth daily.    Vladimir Faster Glycol-Propyl Glycol (SYSTANE OP) Place 1 drop into both eyes  daily.     No current facility-administered medications for this visit.    Allergies:   Sulfa antibiotics   Social History:  The patient  reports that she has quit smoking. Her smoking use included Cigarettes. She has a 30 pack-year smoking history. She has never used smokeless tobacco. She reports that she drinks alcohol. She reports that she does not use illicit drugs.   Family History:  The patient's family history includes Cancer in her mother; Diabetes in her brother, brother, maternal grandfather, and maternal grandmother; Heart disease in her brother, maternal grandfather, and maternal grandmother; Hyperlipidemia in her brother, brother, and son; Hypertension in her sister; Stroke in her paternal grandfather; Tuberculosis in her paternal grandmother.    ROS:  Please see the history of present illness.   Otherwise, review of systems is positive for chest pressure, shortness of breath.   All other systems are reviewed and negative.    PHYSICAL EXAM: VS:  BP 126/78 mmHg  Pulse 78  Ht 5' 5.5" (1.664 m)  Wt 183 lb 6.4 oz (83.19 kg)  BMI 30.04 kg/m2 , BMI Body mass index is 30.04 kg/(m^2). GEN: Well nourished, well developed, in no acute distress HEENT: normal Neck: no JVD, carotid bruits, or masses Cardiac: RRR; no murmurs, rubs, or gallops,no edema  Respiratory:  clear to auscultation bilaterally, normal work of breathing GI: soft, nontender, nondistended, + BS MS: no deformity or atrophy Skin: warm and dry,  device pocket is well healed Neuro:  Strength and sensation are intact Psych: euthymic mood, full affect  EKG:  EKG is not ordered today.    Device interrogation is reviewed today in detail.  See PaceArt for details.   Recent Labs: 04/30/2015: ALT 14 07/08/2015: BUN 12; Creat 0.66; Hemoglobin 14.1; Platelets 233; Potassium 4.1; Sodium 139 09/03/2015: B Natriuretic Peptide 13.5    Lipid Panel     Component Value Date/Time   CHOL 183 01/14/2014 0928   TRIG 118  01/14/2014 0928   HDL 65 01/14/2014 0928   CHOLHDL 2.8 01/14/2014 0928   VLDL 24 01/14/2014 0928   LDLCALC 94 01/14/2014 0928     Wt Readings from Last 3 Encounters:  09/22/15 183 lb 6.4 oz (83.19 kg)  09/12/15 182 lb 12.8 oz (82.918 kg)  09/09/15 181 lb (82.101 kg)      Other studies Reviewed: Additional studies/ records that were reviewed today include: SPECT 09/09/15  Review of the above records today demonstrates:   Nuclear stress EF: 71%.  The left ventricular ejection fraction is hyperdynamic (>65%).  There was no ST segment deviation noted during stress.  The study is normal. There is no evidence of ischemia  This is a low risk study.   ASSESSMENT AND PLAN:  1.  Intermittent complete heart block:currently no symptoms of syncope, and is pacing less than 1% of the time.She is continuing to have quite a bit of SOB.  She has had a lead revision in the past, with little change in her symptoms.  I have offered to extract the device to see if this Yan Pankratz help with her symptoms.  She Bobbijo Holst think about this and call back with a decision.    Current medicines are reviewed at length with the patient today.   The patient does not have concerns regarding her medicines.  The following changes were made today:  none  Labs/ tests ordered today include:  No orders of the defined types were placed in this encounter.     Disposition:   FU with Ival Pacer 1 yearmedications  Signed, Juanna Pudlo Meredith Leeds, MD  09/22/2015 12:14 PM     [address] [place] [address] [phone] (office) [phone] (fax)

## 2015-09-22 ENCOUNTER — Ambulatory Visit (INDEPENDENT_AMBULATORY_CARE_PROVIDER_SITE_OTHER): Payer: Medicare Other | Admitting: Cardiology

## 2015-09-22 ENCOUNTER — Encounter: Payer: Self-pay | Admitting: Cardiology

## 2015-09-22 VITALS — BP 126/78 | HR 78 | Ht 65.5 in | Wt 183.4 lb

## 2015-09-22 DIAGNOSIS — Z950 Presence of cardiac pacemaker: Secondary | ICD-10-CM | POA: Diagnosis not present

## 2015-09-22 NOTE — Patient Instructions (Signed)
Medication Instructions:  Your physician recommends that you continue on your current medications as directed. Please refer to the Current Medication list given to you today.  Labwork: None ordered  Testing/Procedures: None ordered  Follow-Up: Your physician wants you to follow-up in: 1 year with Dr. Camnitz.  You will receive a reminder letter in the mail two months in advance. If you don't receive a letter, please call our office to schedule the follow-up appointment.  If you need a refill on your cardiac medications before your next appointment, please call your pharmacy.  Thank you for choosing [facility]!!   Biagio Snelson, RN [phone]      

## 2015-10-04 HISTORY — PX: INSERT / REPLACE / REMOVE PACEMAKER: SUR710

## 2015-10-15 ENCOUNTER — Encounter: Payer: Medicare Other | Admitting: Cardiology

## 2016-08-10 ENCOUNTER — Telehealth: Payer: Self-pay | Admitting: Cardiology

## 2016-08-10 NOTE — Telephone Encounter (Signed)
Follow up recall for Dr. Curt Bears deleted from [facility]. Device clinic aware pt will not be followed by our clinic anymore.

## 2016-08-10 NOTE — Telephone Encounter (Signed)
New message      Calling to let us know that she had to go to [facility] to have her pacemaker put in for a third time and she will not be coming back here.  Please remove her from the recall so that she will not get any more letters

## 2016-10-13 ENCOUNTER — Other Ambulatory Visit: Payer: Self-pay | Admitting: Family Medicine

## 2016-10-13 DIAGNOSIS — R5381 Other malaise: Secondary | ICD-10-CM

## 2016-10-14 ENCOUNTER — Other Ambulatory Visit: Payer: Self-pay | Admitting: Family Medicine

## 2016-10-14 DIAGNOSIS — M8589 Other specified disorders of bone density and structure, multiple sites: Secondary | ICD-10-CM

## 2016-10-14 DIAGNOSIS — Z1231 Encounter for screening mammogram for malignant neoplasm of breast: Secondary | ICD-10-CM

## 2016-11-02 ENCOUNTER — Ambulatory Visit
Admission: RE | Admit: 2016-11-02 | Discharge: 2016-11-02 | Disposition: A | Discharge status: Home / Self Care | Payer: Medicare Other | Source: Ambulatory Visit | Attending: Family Medicine | Admitting: Family Medicine

## 2016-11-02 DIAGNOSIS — Z1231 Encounter for screening mammogram for malignant neoplasm of breast: Secondary | ICD-10-CM

## 2016-11-02 DIAGNOSIS — M8589 Other specified disorders of bone density and structure, multiple sites: Secondary | ICD-10-CM

## 2017-07-11 DIAGNOSIS — M1712 Unilateral primary osteoarthritis, left knee: Secondary | ICD-10-CM | POA: Diagnosis present

## 2017-07-11 NOTE — H&P (Signed)
PREOPERATIVE H&P Patient ID: [name] MRN: [id number] DOB/AGE: [birth year] 72 y.o.  Chief Complaint: OA LEFT KNEE  Planned Procedure Date: 08/02/17 Medical Clearance by Dr. Melford Aase   Cardiac Clearance by Dr. Metta Clines and Dr. Rosita Fire   HPI: [name] is a 72 y.o. female with a history of complete heart block status post pacemaker placement 05/2015, bilateral knee arthroscopy, mild AS, and back surgery x3 who presents for evaluation of OA LEFT KNEE. The patient has a history of pain and functional disability in the left knee due to arthritis and has failed non-surgical conservative treatments for greater than 12 weeks to include NSAID's and/or analgesics, corticosteriod injections and activity modification.  Onset of symptoms was gradual, starting >10 years ago with gradually worsening course since that time.  Patient currently rates pain at 9 out of 10 with activity. Patient has night pain, worsening of pain with activity and weight bearing and pain that interferes with activities of daily living.  Patient has evidence of subchondral cysts, periarticular osteophytes and joint space narrowing by imaging studies. There is no active infection.  Past Medical History:  Diagnosis Date  . Anxiety   . CAD (coronary artery disease)    a. LHC 8/09 - mLAD myocardial bridge, pLCx 50, mRCA 98  //  b. Myoview 5/11 - No ischemia or scar, breast attenuation, EF 67%, low risk  //  c. Lexiscan Myoview 3/17: EF 71%, no ischemia, low risk  . Carotid stenosis    a. Carotid US 7/15 - +plaque, no significant ICA stenosis  . 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 History of echocardiogram    a. Ech 10/16: EF 60-65%, normal wall motion, grade 1 diastolic dysfunction, aortic valve cusp separation reduced, no stenosis, mean gradient 9 mmHg, MAC, mild MR, mild LAE  . Hx of syncope 05/24/2015  . Hyperlipidemia   . 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 05/24/2015   MDT PPM implant 05/24/15 Dr. Curt Bears, lead revision 07/10/15   Past Surgical History:  Procedure Laterality Date  . ABDOMINAL HYSTERECTOMY    . APPENDECTOMY    . BACK SURGERY    . BREAST EXCISIONAL BIOPSY Left 2008   benign  . CARDIAC CATHETERIZATION  02/22/2008   no significant CAD, 50% narrowing of proximal mid-dom. right Coronary artery. medical therapy along with antiplatlet therapy   . EP IMPLANTABLE DEVICE N/A 05/23/2015   Procedure: Pacemaker Implant;  Surgeon: Will Meredith Leeds, MD;  Location: [facility];  Service: Cardiovascular;  Laterality: N/A;  . EP IMPLANTABLE DEVICE N/A 07/10/2015   Procedure: Lead Revision Pacemaker;  Surgeon: Will Meredith Leeds, MD;  Location: [facility];  Service: Cardiovascular;  Laterality: N/A;  . NM MYOCAR PERF WALL MOTION  11/03/2009   protocol: Persantine, no evidence of ischemia/infarct, post EF67%  . TONSILLECTOMY    . TRANSTHORACIC ECHOCARDIOGRAM  11/12/2010   EF=>55%, moderate calcification of aortic valve leaflets, no MVP seen, normal LV thickness    Allergies  Allergen Reactions  . Sulfa Antibiotics Rash   Prior to Admission medications   Medication Sig Start Date End Date Taking? Authorizing Provider  aspirin 81 MG EC tablet Take 81 mg by mouth every other day. Swallow whole.    [provider]  Calcium-Magnesium-Zinc [phone] MG TABS Take 1 tablet by mouth daily.    [provider]  cholecalciferol (VITAMIN D)  1000 units tablet Take 1,000 Units by mouth daily.    [provider]  clonazePAM (KLONOPIN) 0.5 MG tablet Take 0.5 mg by mouth daily.    [provider]  estrogens, conjugated, (PREMARIN) 1.25 MG tablet Take 1.25 mg by mouth daily.    [provider]  meclizine (ANTIVERT) 25 MG tablet Take 25 mg by mouth daily as needed for dizziness.  06/03/15   [provider]  meloxicam  (MOBIC) 15 MG tablet Take 15 mg by mouth daily.    [provider]  Multiple Vitamins-Minerals (HAIR/SKIN/NAILS) TABS Take 1 tablet by mouth daily.    [provider]  Polyethyl Glycol-Propyl Glycol (SYSTANE OP) Place 1 drop into both eyes daily.    [provider]   Social History: Widowed former smoker.  One pack per day times 30 years.  Quit 2015.  Occasional glass of wine.  Family History  Problem Relation Age of Onset  . Cancer Mother   . Hypertension Sister   . Diabetes Brother   . Hyperlipidemia Brother   . Heart disease Maternal Grandmother   . Diabetes Maternal Grandmother   . Diabetes Maternal Grandfather   . Heart disease Maternal Grandfather   . Tuberculosis Paternal Grandmother   . Stroke Paternal Grandfather   . Diabetes Brother   . Hyperlipidemia Brother   . Heart disease Brother   . Hyperlipidemia Son     ROS: Currently denies lightheadedness, dizziness, Fever, chills, CP, SOB.   No personal history of DVT, PE, MI, or CVA. No loose teeth or dentures All other systems have been reviewed and were otherwise currently negative with the exception of those mentioned in the HPI and as above.  Objective: Vitals: Ht: 5 feet 5-1/2 inches wt: 187 Temp: 97.5 BP: 107/54 Pulse: 62 O2 97% on room air. Physical Exam: General: Alert, NAD.  Trendelenberg Gait  HEENT: EOMI, Good Neck Extension  Pulm: No increased work of breathing.  Clear B/L A/P w/o crackle or wheeze.  CV: RRR, 2/6 SEM. GI: soft, NT, ND Neuro: Neuro without gross focal deficit.  Sensation intact distally Skin: No lesions in the area of chief complaint MSK/Surgical Site: Left knee w/o redness or effusion.  +Medial JLT. ROM 0-115.  5/5 strength in extension and flexion.  +EHL/FHL.  NVI.  Stable varus and valgus stress.    Imaging Review Plain radiographs demonstrate severe degenerative joint disease of the left knee.  Anterior medial wear with preserved lateral space and good medial  opening on stress view.  Assessment: OA LEFT KNEE Principal Problem:   Primary osteoarthritis of left knee Active Problems:   Aortic valve stenosis   Anxiety state   S/P cardiac pacemaker procedure, 05/23/15, MDT Advisa placed.   Plan: Plan for Procedure(s): UNICOMPARTMENTAL KNEE  The patient history, physical exam, clinical judgement of the provider and imaging are consistent with end stage degenerative joint disease and unicompartmental joint arthroplasty is deemed medically necessary. The treatment options including medical management, injection therapy, and arthroplasty were discussed at length. The risks and benefits of Procedure(s): UNICOMPARTMENTAL KNEE were presented and reviewed.  The risks of nonoperative treatment, versus surgical intervention including but not limited to continued pain, aseptic loosening, stiffness, dislocation/subluxation, infection, bleeding, nerve injury, blood clots, cardiopulmonary complications, morbidity, mortality, among others were discussed. The patient verbalizes understanding and wishes to proceed with the plan.  Patient is being admitted for inpatient treatment for surgery, pain control, PT, OT, prophylactic antibiotics, VTE prophylaxis, progressive ambulation, ADL's and discharge planning.  Dental prophylaxis discussed and recommended for 2 years postoperatively.   The patient does meet the criteria for TXA which will be used perioperatively via IV.    ASA 325 mg will be used postoperatively for DVT prophylaxis in addition to SCDs, and early ambulation.  Limit prolonged NSAID use dt heart history.  The patient is planning to be discharged home with home health services ([facility]) in care of her son, daughter-in-law and friend Herbie Baltimore.   Severity of Illness: The appropriate patient status for this patient is OBSERVATION. Observation status is judged to be reasonable and necessary in order to provide the required intensity of service to ensure  the patient's safety. The patient's presenting symptoms, physical exam findings, and initial radiographic and laboratory data in the context of their medical condition is felt to place them at decreased risk for further clinical deterioration. Furthermore, it is anticipated that the patient will be medically stable for discharge from the hospital within 2 midnights of admission. The following factors support the patient status of observation.   Prudencio Burly III, PA-C 07/11/2017 5:40 PM

## 2017-07-20 NOTE — Pre-Procedure Instructions (Signed)
[name]  07/20/2017      [id number] - [place], [place] - [address]. [address] [name] [address] Phone: [phone] Fax: [phone]    Your procedure is scheduled on August 02, 2017.  Report to [facility] Admitting at 1030 AM.  Call this number if you have problems the morning of surgery:  [phone]   Remember:  Do not eat food or drink liquids after midnight.  Take these medicines the morning of surgery with A SIP OF WATER acetaminophen (tylenol), azelastine (astelin) nasal spray, clonazepam (klonopin), famotidine (pepcid), eye drops  7 days prior to surgery STOP taking any diclofenac (voltaren), Aspirin (unless otherwise instructed by your surgeon), Aleve, Naproxen, Ibuprofen, Motrin, Advil, Goody's, BC's, all herbal medications, fish oil, and all vitamins  Continue all other medications as instructed by your physician except follow the above medication instructions before surgery   Do not wear jewelry, make-up or nail polish.  Do not wear lotions, powders, or perfumes, or deodorant.  Do not shave 48 hours prior to surgery.    Do not bring valuables to the hospital.  [facility] is not responsible for any belongings or valuables.  Contacts, dentures or bridgework may not be worn into surgery.  Leave your suitcase in the car.  After surgery it may be brought to your room.  For patients admitted to the hospital, discharge time will be determined by your treatment team.  Patients discharged the day of surgery will not be allowed to drive home.   Special instructions: [facility]- Preparing For Surgery  Before surgery, you can play an important role. Because skin is not sterile, your skin needs to be as free of germs as possible. You can reduce the number of germs on your skin by washing with CHG (chlorahexidine gluconate) Soap before surgery.  CHG is an antiseptic cleaner which kills  germs and bonds with the skin to continue killing germs even after washing.  Please do not use if you have an allergy to CHG or antibacterial soaps. If your skin becomes reddened/irritated stop using the CHG.  Do not shave (including legs and underarms) for at least 48 hours prior to first CHG shower. It is OK to shave your face.  Please follow these instructions carefully.   1. Shower the NIGHT BEFORE SURGERY and the MORNING OF SURGERY with CHG.   2. If you chose to wash your hair, wash your hair first as usual with your normal shampoo.  3. After you shampoo, rinse your hair and body thoroughly to remove the shampoo.  4. Use CHG as you would any other liquid soap. You can apply CHG directly to the skin and wash gently with a scrungie or a clean washcloth.   5. Apply the CHG Soap to your body ONLY FROM THE NECK DOWN.  Do not use on open wounds or open sores. Avoid contact with your eyes, ears, mouth and genitals (private parts). Wash Face and genitals (private parts)  with your normal soap.  6. Wash thoroughly, paying special attention to the area where your surgery will be performed.  7. Thoroughly rinse your body with warm water from the neck down.  8. DO NOT shower/wash with your normal soap after using and rinsing off the CHG Soap.  9. Pat yourself dry with a CLEAN TOWEL.  10. Wear CLEAN PAJAMAS to bed the night before surgery, wear comfortable clothes the morning of surgery  11. Place CLEAN SHEETS on your bed the night of your first shower and DO NOT SLEEP WITH PETS.  Day of Surgery: Do not apply any deodorants/lotions. Please wear clean clothes to the hospital/surgery center.    Please read over the following fact sheets that you were given. Pain Booklet, Coughing and Deep Breathing, MRSA Information and Surgical Site Infection Prevention

## 2017-07-21 ENCOUNTER — Other Ambulatory Visit: Payer: Self-pay

## 2017-07-21 ENCOUNTER — Encounter (HOSPITAL_COMMUNITY): Payer: Self-pay

## 2017-07-21 ENCOUNTER — Encounter (HOSPITAL_COMMUNITY)
Admission: RE | Admit: 2017-07-21 | Discharge: 2017-07-21 | Disposition: A | Discharge status: Home / Self Care | Payer: Medicare Other | Source: Ambulatory Visit | Attending: Orthopedic Surgery | Admitting: Orthopedic Surgery

## 2017-07-21 ENCOUNTER — Other Ambulatory Visit (HOSPITAL_COMMUNITY): Payer: Medicare Other

## 2017-07-21 DIAGNOSIS — Z8542 Personal history of malignant neoplasm of other parts of uterus: Secondary | ICD-10-CM | POA: Insufficient documentation

## 2017-07-21 DIAGNOSIS — E785 Hyperlipidemia, unspecified: Secondary | ICD-10-CM | POA: Diagnosis not present

## 2017-07-21 DIAGNOSIS — I2510 Atherosclerotic heart disease of native coronary artery without angina pectoris: Secondary | ICD-10-CM | POA: Insufficient documentation

## 2017-07-21 DIAGNOSIS — Z950 Presence of cardiac pacemaker: Secondary | ICD-10-CM | POA: Insufficient documentation

## 2017-07-21 DIAGNOSIS — Z01812 Encounter for preprocedural laboratory examination: Secondary | ICD-10-CM | POA: Diagnosis not present

## 2017-07-21 DIAGNOSIS — Z87891 Personal history of nicotine dependence: Secondary | ICD-10-CM | POA: Diagnosis not present

## 2017-07-21 DIAGNOSIS — Z9889 Other specified postprocedural states: Secondary | ICD-10-CM | POA: Insufficient documentation

## 2017-07-21 HISTORY — DX: Headache, unspecified: R51.9

## 2017-07-21 HISTORY — DX: Other specified postprocedural states: Z98.890

## 2017-07-21 HISTORY — DX: Nonrheumatic aortic (valve) stenosis: I35.0

## 2017-07-21 HISTORY — DX: Headache: R51

## 2017-07-21 HISTORY — DX: Gastro-esophageal reflux disease without esophagitis: K21.9

## 2017-07-21 HISTORY — DX: Other specified postprocedural states: R11.2

## 2017-07-21 HISTORY — DX: Unspecified osteoarthritis, unspecified site: M19.90

## 2017-07-21 HISTORY — DX: Malignant (primary) neoplasm, unspecified: C80.1

## 2017-07-21 LAB — CBC
HCT: 43.8 % (ref 36.0–46.0)
HEMOGLOBIN: 14.6 g/dL (ref 12.0–15.0)
MCH: 30.7 pg (ref 26.0–34.0)
MCHC: 33.3 g/dL (ref 30.0–36.0)
MCV: 92 fL (ref 78.0–100.0)
PLATELETS: 192 10*3/uL (ref 150–400)
RBC: 4.76 MIL/uL (ref 3.87–5.11)
RDW: 14.7 % (ref 11.5–15.5)
WBC: 6.5 10*3/uL (ref 4.0–10.5)

## 2017-07-21 LAB — BASIC METABOLIC PANEL
Anion gap: 11 (ref 5–15)
BUN: 12 mg/dL (ref 6–20)
CHLORIDE: 108 mmol/L (ref 101–111)
CO2: 22 mmol/L (ref 22–32)
CREATININE: 0.7 mg/dL (ref 0.44–1.00)
Calcium: 9.2 mg/dL (ref 8.9–10.3)
GFR calc non Af Amer: >60 mL/min (ref >60)
GLUCOSE: 93 mg/dL (ref 65–99)
Potassium: 4.2 mmol/L (ref 3.5–5.1)
Sodium: 141 mmol/L (ref 135–145)

## 2017-07-21 LAB — SURGICAL PCR SCREEN
MRSA, PCR: NEGATIVE
Staphylococcus aureus: NEGATIVE

## 2017-07-21 NOTE — Progress Notes (Signed)
Pt. Followed by Dr. Melford Aase for PCP, also followed by [facility] for Pacemaker mgt. & all cardiac needs. Last pacemaker (different leads) in 2017. Pt. Speaks of a difficult experience with having adjustment with the pacemaker going fr. Care at [facility] to [facility]p. Pt. Denies all cardiac/pulmonary concerns today. Requesting EKG from [facility] , Pacemaker order in process also , both being done by fax. Call to Willeen Cass, DNP, reported history obtained thus far.

## 2017-07-22 ENCOUNTER — Encounter (HOSPITAL_COMMUNITY): Payer: Self-pay

## 2017-07-22 NOTE — Progress Notes (Signed)
Anesthesia Chart Review:  Pt is a 72 year old female scheduled for L unicompartmental knee on 08/02/2017 with Edmonia Lynch, MD  - PCP is Anastasia Pall, MD (notes in care everywhere)  - Cardiologist is Dudley Major, MD (notes in care everywhere)  - EP cardiologist is Norville Haggard, MD (notes in care everywhere)   PMH includes:  CAD (50% RCA by 2009 cath), complete heart block, pacemaker ([employer], implanted 2016, lead revision 07/2015 and 10/2015), aortic stenosis (mild by 2017 echo), hyperlipidemia, uterine cancer (1978), post-op N/V.  Former smoker.  BMI 31.  Medications include: Zetia, Pepcid  BP (!) 122/42   Pulse 66   Temp 36.4 C   Resp 20   Ht 5' 5.5" (1.664 m)   Wt 188 lb 3.2 oz (85.4 kg)   SpO2 99%   BMI 30.84 kg/m   Preoperative labs reviewed.    EKG 02/16/17 [facility]): Sinus bradycardia (59 bpm) with atrial sensed ventricular paced rhythm.  Paced LBBB.  LAD.  Nonspecific intra-ventricular block.  Nuclear stress test 09/27/16 (care everywhere):  1.No substantial inducible ischemia. 2.Normal left ventricular function.  Echo 01/13/16 (care everywhere):  - LV size is normal. Normal LV wall thickness. LV systolic function is normal. No segmental LV wall motion abnormalities. LV ejection fraction = 55-60%. - LV filling pattern is impaired. - RV normal in size and function. Device lead in the right ventricle. - LA mildly dilated. - Catheter/pacemaker lead in the RA cavity. - The aortic valve is trileaflet. There is mild aortic stenosis. Aortic valve peak pressure gradient is 17 mmHg, mean pressure gradient is 9 mmHg. Trace aortic regurgitation. - The mitral valve leaflets appear normal. Mild mitral annular calcification. Mild mitral regurgitation. - Structurally normal tricuspid valve. Trace tricuspid regurgitation. - There is no pericardial effusion.  Cardiac cath 02/22/08:  1. Chest pain, nitrate responsive compatible with ischemia. 2. Mild single vessel coronary  artery disease, 50% proximal - mid RCA. 3. Chronic smoker. 4. Hyperlipidemia. 5. Normal left ventricular function. 6. Lumbosacral back fusion with rods and apparatus visible on fluoroscopy.  Preoperative prescription for pacemaker form pending.  If no changes, I anticipate pt can proceed with surgery as scheduled.   Willeen Cass, FNP-BC [facility]/Anesthesiology Phone: [phone] 07/22/2017 12:44 PM

## 2017-08-01 MED ORDER — LACTATED RINGERS IV SOLN
INTRAVENOUS | Status: DC
  Administered 2017-08-02 (×2): via INTRAVENOUS

## 2017-08-01 MED ORDER — ACETAMINOPHEN 500 MG PO TABS
1000.0000 mg | ORAL_TABLET | Freq: Once | ORAL | Status: AC
  Administered 2017-08-02: 1000 mg via ORAL
  Filled 2017-08-01: qty 2

## 2017-08-01 MED ORDER — CEFAZOLIN SODIUM-DEXTROSE 2-4 GM/100ML-% IV SOLN
2.0000 g | INTRAVENOUS | Status: AC
  Administered 2017-08-02: 2 g via INTRAVENOUS
  Filled 2017-08-01: qty 100

## 2017-08-01 MED ORDER — GABAPENTIN 300 MG PO CAPS
300.0000 mg | ORAL_CAPSULE | Freq: Once | ORAL | Status: AC
  Administered 2017-08-02: 300 mg via ORAL
  Filled 2017-08-01: qty 1

## 2017-08-01 MED ORDER — TRANEXAMIC ACID 1000 MG/10ML IV SOLN
1000.0000 mg | INTRAVENOUS | Status: AC
  Administered 2017-08-02: 1000 mg via INTRAVENOUS
  Filled 2017-08-01: qty 1100

## 2017-08-01 NOTE — Anesthesia Preprocedure Evaluation (Addendum)
Anesthesia Evaluation  Patient identified by MRN, date of birth, ID band Patient awake    Reviewed: Allergy & Precautions, H&P , NPO status , Patient's Chart, lab work & pertinent test results  History of Anesthesia Complications (+) PONV  Airway Mallampati: II  TM Distance: >3 FB Neck ROM: Full    Dental no notable dental hx. (+) Teeth Intact, Dental Advisory Given   Pulmonary neg pulmonary ROS, former smoker,    Pulmonary exam normal breath sounds clear to auscultation       Cardiovascular Exercise Tolerance: Good + CAD and + Peripheral Vascular Disease  + dysrhythmias + pacemaker + Valvular Problems/Murmurs  Rhythm:Regular Rate:Normal     Neuro/Psych  Headaches, Anxiety negative psych ROS   GI/Hepatic Neg liver ROS, GERD  Medicated and Controlled,  Endo/Other  negative endocrine ROS  Renal/GU negative Renal ROS  negative genitourinary   Musculoskeletal  (+) Arthritis , Osteoarthritis,    Abdominal   Peds  Hematology negative hematology ROS (+)   Anesthesia Other Findings   Reproductive/Obstetrics negative OB ROS                            Anesthesia Physical Anesthesia Plan  ASA: III  Anesthesia Plan: General   Post-op Pain Management:  Regional for Post-op pain   Induction: Intravenous  PONV Risk Score and Plan: 4 or greater and Ondansetron, Dexamethasone and Midazolam  Airway Management Planned: LMA  Additional Equipment:   Intra-op Plan:   Post-operative Plan: Extubation in OR  Informed Consent: I have reviewed the patients History and Physical, chart, labs and discussed the procedure including the risks, benefits and alternatives for the proposed anesthesia with the patient or authorized representative who has indicated his/her understanding and acceptance.   Dental advisory given  Plan Discussed with: CRNA  Anesthesia Plan Comments:         Anesthesia  Quick Evaluation

## 2017-08-02 ENCOUNTER — Observation Stay (HOSPITAL_COMMUNITY)
Admission: RE | Admit: 2017-08-02 | Discharge: 2017-08-03 | Disposition: A | Discharge status: Home / Self Care | Payer: Medicare Other | Source: Ambulatory Visit | Attending: Orthopedic Surgery | Admitting: Orthopedic Surgery

## 2017-08-02 ENCOUNTER — Observation Stay (HOSPITAL_COMMUNITY): Payer: Medicare Other

## 2017-08-02 ENCOUNTER — Ambulatory Visit (HOSPITAL_COMMUNITY): Payer: Medicare Other | Admitting: Anesthesiology

## 2017-08-02 ENCOUNTER — Encounter (HOSPITAL_COMMUNITY): Payer: Self-pay | Admitting: Urology

## 2017-08-02 ENCOUNTER — Ambulatory Visit (HOSPITAL_COMMUNITY): Payer: Medicare Other | Admitting: Emergency Medicine

## 2017-08-02 ENCOUNTER — Other Ambulatory Visit: Payer: Self-pay

## 2017-08-02 ENCOUNTER — Encounter (HOSPITAL_COMMUNITY)
Admission: RE | Disposition: A | Discharge status: Home / Self Care | Payer: Self-pay | Source: Ambulatory Visit | Attending: Orthopedic Surgery

## 2017-08-02 DIAGNOSIS — Z809 Family history of malignant neoplasm, unspecified: Secondary | ICD-10-CM | POA: Diagnosis not present

## 2017-08-02 DIAGNOSIS — Z79899 Other long term (current) drug therapy: Secondary | ICD-10-CM | POA: Insufficient documentation

## 2017-08-02 DIAGNOSIS — M1712 Unilateral primary osteoarthritis, left knee: Secondary | ICD-10-CM | POA: Diagnosis not present

## 2017-08-02 DIAGNOSIS — Z90710 Acquired absence of both cervix and uterus: Secondary | ICD-10-CM | POA: Insufficient documentation

## 2017-08-02 DIAGNOSIS — I6529 Occlusion and stenosis of unspecified carotid artery: Secondary | ICD-10-CM | POA: Diagnosis not present

## 2017-08-02 DIAGNOSIS — I350 Nonrheumatic aortic (valve) stenosis: Secondary | ICD-10-CM | POA: Diagnosis not present

## 2017-08-02 DIAGNOSIS — Z8249 Family history of ischemic heart disease and other diseases of the circulatory system: Secondary | ICD-10-CM | POA: Insufficient documentation

## 2017-08-02 DIAGNOSIS — R51 Headache: Secondary | ICD-10-CM | POA: Diagnosis not present

## 2017-08-02 DIAGNOSIS — F419 Anxiety disorder, unspecified: Secondary | ICD-10-CM | POA: Insufficient documentation

## 2017-08-02 DIAGNOSIS — I4510 Unspecified right bundle-branch block: Secondary | ICD-10-CM | POA: Diagnosis not present

## 2017-08-02 DIAGNOSIS — I739 Peripheral vascular disease, unspecified: Secondary | ICD-10-CM | POA: Diagnosis not present

## 2017-08-02 DIAGNOSIS — E785 Hyperlipidemia, unspecified: Secondary | ICD-10-CM | POA: Insufficient documentation

## 2017-08-02 DIAGNOSIS — I2510 Atherosclerotic heart disease of native coronary artery without angina pectoris: Secondary | ICD-10-CM | POA: Diagnosis not present

## 2017-08-02 DIAGNOSIS — Z833 Family history of diabetes mellitus: Secondary | ICD-10-CM | POA: Diagnosis not present

## 2017-08-02 DIAGNOSIS — I442 Atrioventricular block, complete: Secondary | ICD-10-CM | POA: Diagnosis not present

## 2017-08-02 DIAGNOSIS — Z8542 Personal history of malignant neoplasm of other parts of uterus: Secondary | ICD-10-CM | POA: Insufficient documentation

## 2017-08-02 DIAGNOSIS — Z87891 Personal history of nicotine dependence: Secondary | ICD-10-CM | POA: Diagnosis not present

## 2017-08-02 DIAGNOSIS — Z7982 Long term (current) use of aspirin: Secondary | ICD-10-CM | POA: Diagnosis not present

## 2017-08-02 DIAGNOSIS — Z950 Presence of cardiac pacemaker: Secondary | ICD-10-CM | POA: Diagnosis not present

## 2017-08-02 DIAGNOSIS — Z882 Allergy status to sulfonamides status: Secondary | ICD-10-CM | POA: Insufficient documentation

## 2017-08-02 DIAGNOSIS — Z836 Family history of other diseases of the respiratory system: Secondary | ICD-10-CM | POA: Insufficient documentation

## 2017-08-02 DIAGNOSIS — Z96659 Presence of unspecified artificial knee joint: Secondary | ICD-10-CM

## 2017-08-02 DIAGNOSIS — F411 Generalized anxiety disorder: Secondary | ICD-10-CM | POA: Diagnosis present

## 2017-08-02 DIAGNOSIS — K219 Gastro-esophageal reflux disease without esophagitis: Secondary | ICD-10-CM | POA: Diagnosis not present

## 2017-08-02 DIAGNOSIS — M1710 Unilateral primary osteoarthritis, unspecified knee: Secondary | ICD-10-CM | POA: Diagnosis present

## 2017-08-02 HISTORY — PX: PARTIAL KNEE ARTHROPLASTY: SHX2174

## 2017-08-02 HISTORY — PX: REPLACEMENT UNICONDYLAR JOINT KNEE: SUR1227

## 2017-08-02 SURGERY — ARTHROPLASTY, KNEE, UNICOMPARTMENTAL
Anesthesia: General | Laterality: Left

## 2017-08-02 MED ORDER — KETOROLAC TROMETHAMINE 15 MG/ML IJ SOLN
7.5000 mg | Freq: Four times a day (QID) | INTRAMUSCULAR | Status: DC | PRN
  Administered 2017-08-03: 7.5 mg via INTRAVENOUS
  Filled 2017-08-02: qty 1

## 2017-08-02 MED ORDER — METOCLOPRAMIDE HCL 5 MG/ML IJ SOLN
5.0000 mg | Freq: Three times a day (TID) | INTRAMUSCULAR | Status: DC | PRN
  Filled 2017-08-02: qty 2

## 2017-08-02 MED ORDER — PHENYLEPHRINE HCL 10 MG/ML IJ SOLN
INTRAVENOUS | Status: DC | PRN
  Administered 2017-08-02: 25 ug/min via INTRAVENOUS

## 2017-08-02 MED ORDER — OXYCODONE HCL 5 MG PO TABS: 5.0000 mg | ORAL_TABLET | ORAL | 0 refills | Status: AC | PRN

## 2017-08-02 MED ORDER — FAMOTIDINE 20 MG PO TABS
40.0000 mg | ORAL_TABLET | Freq: Every day | ORAL | Status: DC
  Administered 2017-08-03: 40 mg via ORAL
  Filled 2017-08-02: qty 2

## 2017-08-02 MED ORDER — SODIUM CHLORIDE 0.9 % IR SOLN
Status: DC | PRN
  Administered 2017-08-02: 3000 mL

## 2017-08-02 MED ORDER — FENTANYL CITRATE (PF) 100 MCG/2ML IJ SOLN
INTRAMUSCULAR | Status: AC
  Filled 2017-08-02: qty 2

## 2017-08-02 MED ORDER — SODIUM CHLORIDE FLUSH 0.9 % IV SOLN
INTRAVENOUS | Status: DC | PRN
  Administered 2017-08-02: 30 mL

## 2017-08-02 MED ORDER — CEFAZOLIN SODIUM-DEXTROSE 1-4 GM/50ML-% IV SOLN
1.0000 g | Freq: Four times a day (QID) | INTRAVENOUS | Status: AC
  Administered 2017-08-02: 1 g via INTRAVENOUS
  Filled 2017-08-02 (×2): qty 50

## 2017-08-02 MED ORDER — MIDAZOLAM HCL 2 MG/2ML IJ SOLN
INTRAMUSCULAR | Status: DC | PRN
  Administered 2017-08-02 (×2): 1 mg via INTRAVENOUS

## 2017-08-02 MED ORDER — METHOCARBAMOL 500 MG PO TABS
500.0000 mg | ORAL_TABLET | Freq: Four times a day (QID) | ORAL | Status: DC | PRN
  Administered 2017-08-03: 500 mg via ORAL
  Filled 2017-08-02: qty 1

## 2017-08-02 MED ORDER — BACLOFEN 10 MG PO TABS: 10.0000 mg | ORAL_TABLET | Freq: Three times a day (TID) | ORAL | 0 refills | Status: DC | PRN

## 2017-08-02 MED ORDER — BUPIVACAINE HCL (PF) 0.25 % IJ SOLN
INTRAMUSCULAR | Status: AC
  Filled 2017-08-02: qty 30

## 2017-08-02 MED ORDER — ONDANSETRON HCL 4 MG/2ML IJ SOLN
4.0000 mg | Freq: Four times a day (QID) | INTRAMUSCULAR | Status: DC | PRN
  Administered 2017-08-02: 4 mg via INTRAVENOUS

## 2017-08-02 MED ORDER — SORBITOL 70 % SOLN: 30.0000 mL | Freq: Every day | Status: DC | PRN

## 2017-08-02 MED ORDER — CHLORHEXIDINE GLUCONATE 4 % EX LIQD: 60.0000 mL | Freq: Once | CUTANEOUS | Status: DC

## 2017-08-02 MED ORDER — HYDROMORPHONE HCL 1 MG/ML IJ SOLN
0.2500 mg | INTRAMUSCULAR | Status: DC | PRN
  Administered 2017-08-02 (×2): 0.5 mg via INTRAVENOUS

## 2017-08-02 MED ORDER — ROCURONIUM BROMIDE 10 MG/ML (PF) SYRINGE
PREFILLED_SYRINGE | INTRAVENOUS | Status: AC
  Filled 2017-08-02: qty 5

## 2017-08-02 MED ORDER — MIDAZOLAM HCL 2 MG/2ML IJ SOLN
INTRAMUSCULAR | Status: AC
  Filled 2017-08-02: qty 2

## 2017-08-02 MED ORDER — PHENYLEPHRINE HCL 10 MG/ML IJ SOLN
INTRAMUSCULAR | Status: DC | PRN
  Administered 2017-08-02: 120 ug via INTRAVENOUS

## 2017-08-02 MED ORDER — LACTATED RINGERS IV SOLN
INTRAVENOUS | Status: DC
  Administered 2017-08-03: 04:00:00 via INTRAVENOUS

## 2017-08-02 MED ORDER — METHOCARBAMOL 1000 MG/10ML IJ SOLN: 500.0000 mg | Freq: Four times a day (QID) | INTRAVENOUS | Status: DC | PRN

## 2017-08-02 MED ORDER — EPHEDRINE SULFATE 50 MG/ML IJ SOLN
INTRAMUSCULAR | Status: DC | PRN
  Administered 2017-08-02: 5 mg via INTRAVENOUS
  Administered 2017-08-02: 15 mg via INTRAVENOUS
  Administered 2017-08-02 (×2): 10 mg via INTRAVENOUS

## 2017-08-02 MED ORDER — ONDANSETRON HCL 4 MG/2ML IJ SOLN
INTRAMUSCULAR | Status: AC
  Filled 2017-08-02: qty 2

## 2017-08-02 MED ORDER — POLYETHYLENE GLYCOL 3350 17 G PO PACK: 17.0000 g | PACK | Freq: Every day | ORAL | Status: DC | PRN

## 2017-08-02 MED ORDER — OXYCODONE HCL 5 MG PO TABS
10.0000 mg | ORAL_TABLET | ORAL | Status: DC | PRN
  Administered 2017-08-03 (×2): 10 mg via ORAL
  Filled 2017-08-02: qty 2

## 2017-08-02 MED ORDER — DIPHENHYDRAMINE HCL 12.5 MG/5ML PO ELIX: 12.5000 mg | ORAL_SOLUTION | ORAL | Status: DC | PRN

## 2017-08-02 MED ORDER — POLYVINYL ALCOHOL 1.4 % OP SOLN
1.0000 [drp] | Freq: Two times a day (BID) | OPHTHALMIC | Status: DC
  Filled 2017-08-02: qty 15

## 2017-08-02 MED ORDER — DEXAMETHASONE SODIUM PHOSPHATE 10 MG/ML IJ SOLN
10.0000 mg | Freq: Once | INTRAMUSCULAR | Status: AC
  Administered 2017-08-03: 10 mg via INTRAVENOUS
  Filled 2017-08-02: qty 1

## 2017-08-02 MED ORDER — LIDOCAINE HCL (CARDIAC) 20 MG/ML IV SOLN
INTRAVENOUS | Status: DC | PRN
  Administered 2017-08-02: 100 mg via INTRATRACHEAL

## 2017-08-02 MED ORDER — EPHEDRINE 5 MG/ML INJ
INTRAVENOUS | Status: AC
  Filled 2017-08-02: qty 10

## 2017-08-02 MED ORDER — DOCUSATE SODIUM 100 MG PO CAPS
100.0000 mg | ORAL_CAPSULE | Freq: Two times a day (BID) | ORAL | Status: DC
  Administered 2017-08-02: 100 mg via ORAL
  Filled 2017-08-02 (×2): qty 1

## 2017-08-02 MED ORDER — HYDROMORPHONE HCL 1 MG/ML IJ SOLN: 0.5000 mg | INTRAMUSCULAR | Status: DC | PRN

## 2017-08-02 MED ORDER — ACETAMINOPHEN 325 MG PO TABS
650.0000 mg | ORAL_TABLET | ORAL | Status: DC | PRN
  Filled 2017-08-02: qty 2

## 2017-08-02 MED ORDER — OMEPRAZOLE 20 MG PO CPDR: 20.0000 mg | DELAYED_RELEASE_CAPSULE | Freq: Every day | ORAL | 0 refills | Status: DC

## 2017-08-02 MED ORDER — ACETAMINOPHEN 500 MG PO TABS
1000.0000 mg | ORAL_TABLET | Freq: Three times a day (TID) | ORAL | Status: DC
  Administered 2017-08-02 – 2017-08-03 (×3): 1000 mg via ORAL
  Filled 2017-08-02 (×3): qty 2

## 2017-08-02 MED ORDER — ACETAMINOPHEN 500 MG PO TABS: 1000.0000 mg | ORAL_TABLET | Freq: Three times a day (TID) | ORAL | 0 refills | Status: AC

## 2017-08-02 MED ORDER — DEXAMETHASONE SODIUM PHOSPHATE 10 MG/ML IJ SOLN
INTRAMUSCULAR | Status: DC | PRN
  Administered 2017-08-02: 10 mg via INTRAVENOUS

## 2017-08-02 MED ORDER — EZETIMIBE 10 MG PO TABS
10.0000 mg | ORAL_TABLET | Freq: Every day | ORAL | Status: DC
  Administered 2017-08-03: 10 mg via ORAL
  Filled 2017-08-02: qty 1

## 2017-08-02 MED ORDER — ONDANSETRON HCL 4 MG/2ML IJ SOLN
INTRAMUSCULAR | Status: DC | PRN
  Administered 2017-08-02: 4 mg via INTRAVENOUS

## 2017-08-02 MED ORDER — PROPOFOL 10 MG/ML IV BOLUS
INTRAVENOUS | Status: DC | PRN
  Administered 2017-08-02: 150 mg via INTRAVENOUS

## 2017-08-02 MED ORDER — FENTANYL CITRATE (PF) 100 MCG/2ML IJ SOLN
INTRAMUSCULAR | Status: DC | PRN
  Administered 2017-08-02: 50 ug via INTRAVENOUS
  Administered 2017-08-02: 100 ug via INTRAVENOUS

## 2017-08-02 MED ORDER — LIDOCAINE 2% (20 MG/ML) 5 ML SYRINGE
INTRAMUSCULAR | Status: AC
  Filled 2017-08-02: qty 5

## 2017-08-02 MED ORDER — FENTANYL CITRATE (PF) 250 MCG/5ML IJ SOLN
INTRAMUSCULAR | Status: AC
  Filled 2017-08-02: qty 5

## 2017-08-02 MED ORDER — POLYETHYL GLYCOL-PROPYL GLYCOL 0.4-0.3 % OP GEL
Freq: Two times a day (BID) | OPHTHALMIC | Status: DC
  Filled 2017-08-02: qty 10

## 2017-08-02 MED ORDER — ONDANSETRON HCL 4 MG PO TABS: 4.0000 mg | ORAL_TABLET | Freq: Three times a day (TID) | ORAL | 0 refills | Status: DC | PRN

## 2017-08-02 MED ORDER — ACETAMINOPHEN 650 MG RE SUPP: 650.0000 mg | RECTAL | Status: DC | PRN

## 2017-08-02 MED ORDER — PROPOFOL 10 MG/ML IV BOLUS
INTRAVENOUS | Status: AC
  Filled 2017-08-02: qty 20

## 2017-08-02 MED ORDER — ROPIVACAINE HCL 7.5 MG/ML IJ SOLN
INTRAMUSCULAR | Status: DC | PRN
  Administered 2017-08-02: 20 mL via PERINEURAL

## 2017-08-02 MED ORDER — OXYCODONE HCL 5 MG PO TABS
5.0000 mg | ORAL_TABLET | ORAL | Status: DC | PRN
  Administered 2017-08-02: 5 mg via ORAL
  Filled 2017-08-02 (×3): qty 1

## 2017-08-02 MED ORDER — ASPIRIN EC 325 MG PO TBEC: 325.0000 mg | DELAYED_RELEASE_TABLET | Freq: Every day | ORAL | 0 refills | Status: DC

## 2017-08-02 MED ORDER — BUPIVACAINE LIPOSOME 1.3 % IJ SUSP
20.0000 mL | INTRAMUSCULAR | Status: AC
  Administered 2017-08-02: 20 mL
  Filled 2017-08-02: qty 20

## 2017-08-02 MED ORDER — HYDROMORPHONE HCL 1 MG/ML IJ SOLN
INTRAMUSCULAR | Status: AC
  Administered 2017-08-02: 0.5 mg via INTRAVENOUS
  Filled 2017-08-02: qty 1

## 2017-08-02 MED ORDER — SENNA 8.6 MG PO TABS
1.0000 | ORAL_TABLET | Freq: Two times a day (BID) | ORAL | Status: DC
  Administered 2017-08-02: 8.6 mg via ORAL
  Filled 2017-08-02 (×2): qty 1

## 2017-08-02 MED ORDER — CLONAZEPAM 0.5 MG PO TABS: 0.5000 mg | ORAL_TABLET | Freq: Every day | ORAL | Status: DC | PRN

## 2017-08-02 MED ORDER — ASPIRIN EC 325 MG PO TBEC
325.0000 mg | DELAYED_RELEASE_TABLET | Freq: Every day | ORAL | Status: DC
  Administered 2017-08-03: 325 mg via ORAL
  Filled 2017-08-02: qty 1

## 2017-08-02 MED ORDER — PHENOL 1.4 % MT LIQD: 1.0000 | OROMUCOSAL | Status: DC | PRN

## 2017-08-02 MED ORDER — METOCLOPRAMIDE HCL 5 MG PO TABS: 5.0000 mg | ORAL_TABLET | Freq: Three times a day (TID) | ORAL | Status: DC | PRN

## 2017-08-02 MED ORDER — DOCUSATE SODIUM 100 MG PO CAPS
100.0000 mg | ORAL_CAPSULE | Freq: Two times a day (BID) | ORAL | 0 refills | Status: DC
Start: 2017-08-02 — End: 2018-11-22

## 2017-08-02 MED ORDER — ONDANSETRON HCL 4 MG PO TABS
4.0000 mg | ORAL_TABLET | Freq: Four times a day (QID) | ORAL | Status: DC | PRN
  Administered 2017-08-03: 4 mg via ORAL
  Filled 2017-08-02: qty 1

## 2017-08-02 MED ORDER — MENTHOL 3 MG MT LOZG: 1.0000 | LOZENGE | OROMUCOSAL | Status: DC | PRN

## 2017-08-02 SURGICAL SUPPLY — 48 items
BANDAGE ESMARK 6X9 LF (GAUZE/BANDAGES/DRESSINGS) ×1 IMPLANT
BNDG CMPR 9X6 STRL LF SNTH (GAUZE/BANDAGES/DRESSINGS) ×1
BNDG ESMARK 6X9 LF (GAUZE/BANDAGES/DRESSINGS) ×3
BOWL SMART MIX CTS (DISPOSABLE) ×3 IMPLANT
CAPT KNEE PARTIAL 2 ×2 IMPLANT
CEMENT BONE R 1X40 (Cement) ×4 IMPLANT
CHLORAPREP W/TINT 26ML (MISCELLANEOUS) ×5 IMPLANT
CLOSURE STERI-STRIP 1/2X4 (GAUZE/BANDAGES/DRESSINGS) ×1
CLSR STERI-STRIP ANTIMIC 1/2X4 (GAUZE/BANDAGES/DRESSINGS) ×1 IMPLANT
COVER SURGICAL LIGHT HANDLE (MISCELLANEOUS) ×3 IMPLANT
CUFF TOURNIQUET SINGLE 34IN LL (TOURNIQUET CUFF) ×3 IMPLANT
DRAPE ARTHROSCOPY W/POUCH 114 (DRAPES) ×3 IMPLANT
DRAPE HALF SHEET 40X57 (DRAPES) ×3 IMPLANT
DRSG MEPILEX BORDER 4X4 (GAUZE/BANDAGES/DRESSINGS) IMPLANT
DRSG MEPILEX BORDER 4X8 (GAUZE/BANDAGES/DRESSINGS) ×3 IMPLANT
ELECT REM PT RETURN 9FT ADLT (ELECTROSURGICAL) ×3
ELECTRODE REM PT RTRN 9FT ADLT (ELECTROSURGICAL) ×1 IMPLANT
EVACUATOR 1/8 PVC DRAIN (DRAIN) IMPLANT
FACESHIELD WRAPAROUND (MASK) ×6 IMPLANT
FACESHIELD WRAPAROUND OR TEAM (MASK) ×2 IMPLANT
GLOVE BIO SURGEON STRL SZ7.5 (GLOVE) ×6 IMPLANT
GLOVE BIOGEL PI IND STRL 8 (GLOVE) ×2 IMPLANT
GLOVE BIOGEL PI INDICATOR 8 (GLOVE) ×4
GOWN STRL REUS W/ TWL LRG LVL3 (GOWN DISPOSABLE) ×2 IMPLANT
GOWN STRL REUS W/ TWL XL LVL3 (GOWN DISPOSABLE) ×1 IMPLANT
GOWN STRL REUS W/TWL LRG LVL3 (GOWN DISPOSABLE) ×6
GOWN STRL REUS W/TWL XL LVL3 (GOWN DISPOSABLE) ×3
HANDPIECE INTERPULSE COAX TIP (DISPOSABLE) ×3
IMMOBILIZER KNEE 22 UNIV (SOFTGOODS) ×3 IMPLANT
KIT BASIN OR (CUSTOM PROCEDURE TRAY) ×3 IMPLANT
KIT ROOM TURNOVER OR (KITS) ×3 IMPLANT
MANIFOLD NEPTUNE II (INSTRUMENTS) ×3 IMPLANT
NS IRRIG 1000ML POUR BTL (IV SOLUTION) ×3 IMPLANT
PACK BLADE SAW RECIP 70 3 PT (BLADE) ×2 IMPLANT
PACK TOTAL JOINT (CUSTOM PROCEDURE TRAY) ×3 IMPLANT
PAD ARMBOARD 7.5X6 YLW CONV (MISCELLANEOUS) ×3 IMPLANT
SET HNDPC FAN SPRY TIP SCT (DISPOSABLE) ×1 IMPLANT
STAPLER VISISTAT 35W (STAPLE) IMPLANT
SUCTION FRAZIER HANDLE 10FR (MISCELLANEOUS) ×2
SUCTION TUBE FRAZIER 10FR DISP (MISCELLANEOUS) ×1 IMPLANT
SUT MNCRL AB 4-0 PS2 18 (SUTURE) ×2 IMPLANT
SUT MON AB 2-0 CT1 27 (SUTURE) ×6 IMPLANT
SUT MON AB 2-0 CT1 36 (SUTURE) ×2 IMPLANT
SUT VIC AB 0 CT1 27 (SUTURE) ×3
SUT VIC AB 0 CT1 27XBRD ANBCTR (SUTURE) IMPLANT
SUT VIC AB 1 CTX 36 (SUTURE) ×6
SUT VIC AB 1 CTX36XBRD ANBCTR (SUTURE) ×2 IMPLANT
TOWEL OR 17X26 10 PK STRL BLUE (TOWEL DISPOSABLE) ×3 IMPLANT

## 2017-08-02 NOTE — Anesthesia Procedure Notes (Signed)
Procedure Name: LMA Insertion Date/Time: 08/02/2017 7:35 AM Performed by: Lance Coon, CRNA Pre-anesthesia Checklist: Patient identified, Emergency Drugs available, Suction available, Patient being monitored and Timeout performed Patient Re-evaluated:Patient Re-evaluated prior to induction Oxygen Delivery Method: Circle system utilized Preoxygenation: Pre-oxygenation with 100% oxygen Induction Type: IV induction LMA: LMA inserted LMA Size: 4.0 Number of attempts: 1 Placement Confirmation: breath sounds checked- equal and bilateral and positive ETCO2 Tube secured with: Tape Dental Injury: Teeth and Oropharynx as per pre-operative assessment

## 2017-08-02 NOTE — Transfer of Care (Signed)
Immediate Anesthesia Transfer of Care Note  Patient: [name]  Procedure(s) Performed: UNICOMPARTMENTAL KNEE (Left )  Patient Location: PACU  Anesthesia Type:General and GA combined with regional for post-op pain  Level of Consciousness: awake and alert   Airway & Oxygen Therapy: Patient Spontanous Breathing  Post-op Assessment: Report given to RN and Post -op Vital signs reviewed and stable  Post vital signs: Reviewed and stable  Last Vitals:  Vitals:   08/02/17 0604  BP: (!) 141/45  Pulse: 68  Resp: 20  Temp: 36.5 C  SpO2: 99%    Last Pain:  Vitals:   08/02/17 0604  TempSrc: Oral  PainSc:          Complications: No apparent anesthesia complications

## 2017-08-02 NOTE — Anesthesia Procedure Notes (Signed)
Anesthesia Regional Block: Adductor canal block   Pre-Anesthetic Checklist: ,, timeout performed, Correct Patient, Correct Site, Correct Laterality, Correct Procedure, Correct Position, site marked, Risks and benefits discussed, pre-op evaluation,  At surgeon's request and post-op pain management  Laterality: Left  Prep: Maximum Sterile Barrier Precautions used, chloraprep       Needles:  Injection technique: Single-shot  Needle Type: Echogenic Stimulator Needle     Needle Length: 9cm  Needle Gauge: 21     Additional Needles:   Procedures:,,,, ultrasound used (permanent image in chart),,,,  Narrative:  Start time: 08/02/2017 6:57 AM End time: 08/02/2017 7:07 AM Injection made incrementally with aspirations every 5 mL.  Performed by: Personally  Anesthesiologist: Roderic Palau, MD  Additional Notes: 2% Lidocaine skin wheel.

## 2017-08-02 NOTE — Discharge Instructions (Signed)

## 2017-08-02 NOTE — Anesthesia Postprocedure Evaluation (Signed)
Anesthesia Post Note  Patient: [name]  Procedure(s) Performed: UNICOMPARTMENTAL KNEE (Left )     Patient location during evaluation: PACU Anesthesia Type: General and Regional Level of consciousness: awake and alert Pain management: pain level controlled Vital Signs Assessment: post-procedure vital signs reviewed and stable Respiratory status: spontaneous breathing, nonlabored ventilation, respiratory function stable and patient connected to nasal cannula oxygen Cardiovascular status: blood pressure returned to baseline and stable Postop Assessment: no apparent nausea or vomiting Anesthetic complications: no    Last Vitals:  Vitals:   08/02/17 1123 08/02/17 1153  BP: 126/64 122/67  Pulse: 92 93  Resp: 14 14  Temp:    SpO2: 95% 99%    Last Pain:  Vitals:   08/02/17 0951  TempSrc:   PainSc: Asleep                 Ohanna Gassert,W. EDMOND

## 2017-08-02 NOTE — Evaluation (Signed)
Physical Therapy Evaluation Patient Details Name: [name] MRN: [id number] DOB: Mar 31, 1946 Today's Date: 08/02/2017   History of Present Illness  Pt is a 72 y/o female s/p elective L unicompartmental knee replacement. PMH includes CAD, and complete heart block s/p pacemaker placement.   Clinical Impression  Pt is s/p surgery above with deficits below. Pt very limited by nausea/vomiting this session. Only able to perform partial sit before having to return to supine. L knee moving well and able to perform supine HEP otherwise. Anticipate pt will progress well once nausea/vomiting controlled. Will follow acutely to maximize functional mobility independence and safety.     Follow Up Recommendations DC plan and follow up therapy as arranged by surgeon;Supervision for mobility/OOB    Equipment Recommendations  Rolling walker with 5" wheels(refused 3 in 1)    Recommendations for Other Services       Precautions / Restrictions Precautions Precautions: Knee Precaution Booklet Issued: Yes (comment) Precaution Comments: Reviewed supine knee ther ex. Limited tolerance secondary to nausea/vomiting.  Restrictions Weight Bearing Restrictions: Yes LLE Weight Bearing: Weight bearing as tolerated      Mobility  Bed Mobility Overal bed mobility: Needs Assistance Bed Mobility: Supine to Sit;Sit to Supine     Supine to sit: Supervision Sit to supine: Supervision   General bed mobility comments: Attempted to perform supine>sit transfer; after performing partial sit, pt with increased nausea and episode of vomiting; RN notified. Returned to supine and nausea resolved.   Transfers                 General transfer comment: Deferred secondary to nausea/vomiting.   Ambulation/Gait                Stairs            Wheelchair Mobility    Modified Rankin (Stroke Patients Only)       Balance                                              Pertinent Vitals/Pain Pain Assessment: 0-10 Pain Score: 5  Pain Location: L knee  Pain Descriptors / Indicators: Aching;Operative site guarding Pain Intervention(s): Limited activity within patient's tolerance;Monitored during session;Repositioned    Home Living Family/patient expects to be discharged to:: Private residence Living Arrangements: Spouse/significant other Available Help at Discharge: Family;Available 24 hours/day Type of Home: House Home Access: Stairs to enter Entrance Stairs-Rails: None Entrance Stairs-Number of Steps: 3 Home Layout: One level Home Equipment: Walker - standard      Prior Function Level of Independence: Independent               Hand Dominance        Extremity/Trunk Assessment   Upper Extremity Assessment Upper Extremity Assessment: Defer to OT evaluation    Lower Extremity Assessment Lower Extremity Assessment: LLE deficits/detail LLE Deficits / Details: Sensory in tact. Deficits consistent with post op pain and weakness.     Cervical / Trunk Assessment Cervical / Trunk Assessment: Other exceptions Cervical / Trunk Exceptions: Reports 3 back surgeries which limits movement.   Communication   Communication: No difficulties  Cognition Arousal/Alertness: Awake/alert Behavior During Therapy: WFL for tasks assessed/performed Overall Cognitive Status: Within Functional Limits for tasks assessed  General Comments General comments (skin integrity, edema, etc.): Verbal education about POC and role of PT. Educated about exercise to perform tonight once feeling better.     Exercises Total Joint Exercises Ankle Circles/Pumps: AROM;Both;20 reps Quad Sets: AROM;Left;10 reps Towel Squeeze: (verbal education and demonstration ) Heel Slides: AROM;Left;Other reps (comment)(3 reps; limited by nausea/vomiting. )   Assessment/Plan    PT Assessment Patient needs continued PT  services  PT Problem List Decreased strength;Decreased range of motion;Decreased activity tolerance;Decreased balance;Decreased knowledge of use of DME;Decreased knowledge of precautions;Pain       PT Treatment Interventions DME instruction;Gait training;Functional mobility training;Therapeutic activities;Stair training;Therapeutic exercise;Balance training;Neuromuscular re-education;Patient/family education    PT Goals (Current goals can be found in the Care Plan section)  Acute Rehab PT Goals Patient Stated Goal: to feel better  PT Goal Formulation: With patient Time For Goal Achievement: 08/16/17 Potential to Achieve Goals: Good    Frequency 7X/week   Barriers to discharge        Co-evaluation               AM-PAC PT "6 Clicks" Daily Activity  Outcome Measure Difficulty turning over in bed (including adjusting bedclothes, sheets and blankets)?: A Little Difficulty moving from lying on back to sitting on the side of the bed? : Unable Difficulty sitting down on and standing up from a chair with arms (e.g., wheelchair, bedside commode, etc,.)?: Unable Help needed moving to and from a bed to chair (including a wheelchair)?: A Little Help needed walking in hospital room?: A Lot Help needed climbing 3-5 steps with a railing? : A Lot 6 Click Score: 12    End of Session   Activity Tolerance: Treatment limited secondary to medical complications (Comment)(nausea/vomiting ) Patient left: in bed;with call bell/phone within reach;with family/visitor present Nurse Communication: Mobility status(nausea/vomiting ) PT Visit Diagnosis: Other abnormalities of gait and mobility (R26.89);Pain Pain - Right/Left: Left Pain - part of body: Knee    Time: 3354-5625 PT Time Calculation (min) (ACUTE ONLY): 30 min   Charges:   PT Evaluation $PT Eval Low Complexity: 1 Low PT Treatments $Therapeutic Exercise: 8-22 mins   PT G Codes:        Leighton Ruff, PT, DPT  Acute  Rehabilitation Services  Pager: [phone]   Rudean Hitt 08/02/2017, 4:40 PM

## 2017-08-02 NOTE — Interval H&P Note (Signed)
History and Physical Interval Note:  08/02/2017 7:25 AM  [name]  has presented today for surgery, with the diagnosis of OA LEFT KNEE  The various methods of treatment have been discussed with the patient and family. After consideration of risks, benefits and other options for treatment, the patient has consented to  Procedure(s): UNICOMPARTMENTAL KNEE (Left) as a surgical intervention .  The patient's history has been reviewed, patient examined, no change in status, stable for surgery.  I have reviewed the patient's chart and labs.  Questions were answered to the patient's satisfaction.     Deaja Rizo D

## 2017-08-02 NOTE — Op Note (Signed)
08/02/2017  8:36 AM  PATIENT:  [name]    PRE-OPERATIVE DIAGNOSIS:  OA LEFT KNEE  POST-OPERATIVE DIAGNOSIS:  Same  PROCEDURE:  UNICOMPARTMENTAL KNEE  SURGEON:  Jaquarius Seder D, MD  PHYSICIAN ASSISTANT: Roxan Hockey, PA-C, he was present and scrubbed throughout the case, critical for completion in a timely fashion, and for retraction, instrumentation, and closure.   ANESTHESIA:   General  PREOPERATIVE INDICATIONS:  [name] is a  72 y.o. female with a diagnosis of OA LEFT KNEE who failed conservative measures and elected for surgical management.    The risks benefits and alternatives were discussed with the patient preoperatively including but not limited to the risks of infection, bleeding, nerve injury, cardiopulmonary complications, blood clots, the need for revision surgery, among others, and the patient was willing to proceed.  OPERATIVE IMPLANTS: Biomet Oxford mobile bearing medial compartment arthroplasty. Femoral Component: medium. Tibial tray: c, Size 4 poly.   OPERATIVE FINDINGS: Endstage grade 4 medial compartment osteoarthritis. No significant changes in the lateral or patellofemoral joint  OPERATIVE PROCEDURE: The patient was brought to the operating room placed in supine position. General anesthesia was administered. IV antibiotics were given. The lower extremity was placed in the legholder and prepped and draped in usual sterile fashion.  Time out was performed.  The leg was elevated and exsanguinated and the tourniquet was inflated. Anteromedial incision was performed, and I took care to preserve the MCL. Parapatellar incision was carried out, and the osteophytes were excised, along with the medial meniscus and a small portion of the fat pad.  The extra medullary tibial cutting jig was applied, using the spoon and the 33mm G-Clamp, and I took care to protect the anterior cruciate ligament insertion and the tibial spine. The medial collateral  ligament was also protected, and I resected my proximal tibia, matching the anatomic slope.   The proximal tibial bony cut was removed in one piece, and I turned my attention to the femur.  The intramedullary femoral rod was placed using the drill, and then using the appropriate reference, I assembled the femoral jig, setting my posterior cutting block. I resected my posterior femur, and then measured my gap.   I then used the mill to match the extension gap to the flexion gap. The gaps were then measured again with the appropriate feeler gauges. Once I had balanced flexion and extension gaps, I then completed the preparation of the femur.  I milled off the anterior aspect of the distal femur to prevent impingement. I also exposed the tibia, and selected the above-named component, and then used the cutting jig to prepare the keel slot on the tibia. I also used the awl to curette out the bone to complete the preparation of the keel. The back wall was intact.  I then placed trial components, and it was found to have excellent motion, and appropriate balance.  I then cemented the components into place, cementing the tibia first, removing all excess cement, and then cementing the femur.  All loose cement was removed.  The real polyethylene insert was applied manually, and the knee was taken through functional range of motion, and found to have excellent stability and restoration of joint motion, with excellent balance.  The wounds were irrigated copiously, and the parapatellar tissue closed with Vicryl, followed by Vicryl for the subcutaneous tissue, with routine closure with Steri-Strips and sterile gauze.  The tourniquet was released, and the patient was awakened and extubated and returned to PACU in  stable and satisfactory condition. There were no complications.  POSTOPERATIVE PLAN: DVT px will consist of SCD's and chemical px, WBAT     Renette Butters, MD

## 2017-08-02 NOTE — Progress Notes (Signed)
Orthopedic Tech Progress Note Patient Details:  [name] 10-06-1945 [id number]  CPM Left Knee CPM Left Knee: On Left Knee Flexion (Degrees): 90 Left Knee Extension (Degrees): 0 Additional Comments: trapeze bar patient helper  Post Interventions Patient Tolerated: Well Instructions Provided: Care of device  Hildred Priest 08/02/2017, 9:51 AM Viewed order from doctor's order list

## 2017-08-03 DIAGNOSIS — M1712 Unilateral primary osteoarthritis, left knee: Secondary | ICD-10-CM | POA: Diagnosis not present

## 2017-08-03 NOTE — Progress Notes (Signed)
   Assessment / Plan: 1 Day Post-Op  S/P Procedure(s) (LRB): UNICOMPARTMENTAL KNEE (Left) by Dr. Ernesta Amble. Murphy on 08/02/2017  Principal Problem:   Primary osteoarthritis of left knee Active Problems:   Aortic valve stenosis   Anxiety state   S/P cardiac pacemaker procedure, 05/23/15, MDT Advisa placed.   Primary osteoarthritis of knee   Status post left unicompartmental knee arthroplasty Doing well postop day 1. Pain controlled.  Eating, drinking, and voiding.  Early PONV resolved. Eager to mobilize more and DC to home  Advance diet Up with therapy D/C IV fluids Incentive Spirometry Elevate and apply ice  Weight Bearing: Weight Bearing as Tolerated (WBAT)  Dressings: Maintain Mepilex.  VTE prophylaxis: Aspirin, SCDs, ambulation Dispo: Home today after therapy evaluations  Subjective: Patient reports pain as mild to moderate.  Tolerating diet.  Urinating.  No CP, SOB.  OOB mobilizing in room.  Objective:   VITALS:   Vitals:   08/02/17 1355 08/02/17 1400 08/02/17 2000 08/03/17 0025  BP: 125/67 140/75 124/64 (!) 111/58  Pulse: 93 95 98 97  Resp: 13 16 16 18   Temp:   97.8 F (36.6 C) 98 F (36.7 C)  TempSrc:  Oral Oral Oral  SpO2: 95% 97% 96% 96%   CBC Latest Ref Rng & Units 07/21/2017 07/08/2015 05/21/2015  WBC 4.0 - 10.5 K/uL 6.5 8.6 8.7  Hemoglobin 12.0 - 15.0 g/dL 14.6 14.1 13.8  Hematocrit 36.0 - 46.0 % 43.8 40.2 40.7  Platelets 150 - 400 K/uL 192 233 219   BMP Latest Ref Rng & Units 07/21/2017 07/08/2015 05/21/2015  Glucose 65 - 99 mg/dL 93 81 101(H)  BUN 6 - 20 mg/dL 12 12 14   Creatinine 0.44 - 1.00 mg/dL 0.70 0.66 0.65  Sodium 135 - 145 mmol/L 141 139 139  Potassium 3.5 - 5.1 mmol/L 4.2 4.1 4.3  Chloride 101 - 111 mmol/L 108 102 102  CO2 22 - 32 mmol/L 22 26 28   Calcium 8.9 - 10.3 mg/dL 9.2 9.2 9.5   Intake/Output      01/29 0701 - 01/30 0700 01/30 0701 - 01/31 0700   P.O. 480    I.V. 1100    Other 300    Total Intake 1880    Urine 1300    Emesis/NG output 400    Blood 30    Total Output 1730    Net +150         Urine Occurrence 1 x       Physical Exam: General: NAD.  Upright in bed.  Calm, pleasant, conversant.  Payton Spark in room. Resp: No increased wob Cardio: regular rate.  Paced rhythm. ABD soft Neurologically intact MSK Neurovascularly intact Sensation intact distally Intact pulses distally Dorsiflexion/Plantar flexion intact Incision: dressing C/D/I   Prudencio Burly III, PA-C 08/03/2017, 7:28 AM

## 2017-08-03 NOTE — Progress Notes (Signed)
Physical Therapy Treatment Patient Details Name: [name] MRN: [id number] DOB: 1946-03-11 Today's Date: 08/03/2017    History of Present Illness Pt is a 72 y/o female s/p elective L unicompartmental knee replacement. PMH includes CAD, and complete heart block s/p pacemaker placement.     PT Comments    Continuing work on functional mobility and activity tolerance;  L knee ROM very good; Good quad contraction and very nice knee control with bed mobiltiy and transfers;   Unable to assess ambulation and complete stair training this session due to BP drop with standing; Symptomatic for lightheadedness and being hot with standing; Attempted BP in standing, however she needed to sit; BP finished once sitting: 63/36; got back supine, RN notified and started fluid bolus;   Will plan to return near lunchtime to continue to assess activity tolerance    08/03/17 0850 08/03/17 0851  Vital Signs  Pulse Rate 64 88  BP (!) 63/36 (!) 107/57  Patient Position (if appropriate) Sitting Lying (Semi-Trendelenburg)     Follow Up Recommendations  DC plan and follow up therapy as arranged by surgeon;Supervision for mobility/OOB     Equipment Recommendations  Rolling walker with 5" wheels    Recommendations for Other Services       Precautions / Restrictions Precautions Precautions: Knee Precaution Booklet Issued: Yes (comment) Precaution Comments: Pt educated to not allow any pillow or bolster under knee for healing with optimal range of motion.  Restrictions LLE Weight Bearing: Weight bearing as tolerated    Mobility  Bed Mobility Overal bed mobility: Needs Assistance Bed Mobility: Supine to Sit;Sit to Supine     Supine to sit: Supervision Sit to supine: Supervision   General bed mobility comments: Able to get to fully upright sitting position; cues to self-monitor for activity tolerance  Transfers Overall transfer level: Needs assistance Equipment used: Rolling walker (2  wheeled) Transfers: Sit to/from Stand Sit to Stand: Min guard(without physical contact)         General transfer comment: No difficulty with transition to stand; Became hot, lightheaded standing; sat back down to bed   Ambulation/Gait             General Gait Details: Deferred due to symptomatic for orthostasis in standing   Stairs            Wheelchair Mobility    Modified Rankin (Stroke Patients Only)       Balance                                            Cognition Arousal/Alertness: Awake/alert Behavior During Therapy: WFL for tasks assessed/performed Overall Cognitive Status: Within Functional Limits for tasks assessed                                        Exercises Total Joint Exercises Ankle Circles/Pumps: AROM;Both;20 reps Quad Sets: AROM;Left;10 reps Heel Slides: AAROM;Left;10 reps Straight Leg Raises: AROM;Left;10 reps Goniometric ROM: approx 2-95 deg    General Comments        Pertinent Vitals/Pain Pain Assessment: Faces Faces Pain Scale: Hurts little more Pain Location: L knee  Pain Descriptors / Indicators: Aching;Operative site guarding Pain Intervention(s): Monitored during session    Home Living  Prior Function            PT Goals (current goals can now be found in the care plan section) Acute Rehab PT Goals Patient Stated Goal: to feel better  PT Goal Formulation: With patient Time For Goal Achievement: 08/16/17 Potential to Achieve Goals: Good Progress towards PT goals: Progressing toward goals    Frequency    7X/week      PT Plan Current plan remains appropriate    Co-evaluation              AM-PAC PT "6 Clicks" Daily Activity  Outcome Measure  Difficulty turning over in bed (including adjusting bedclothes, sheets and blankets)?: A Little Difficulty moving from lying on back to sitting on the side of the bed? : A Little Difficulty  sitting down on and standing up from a chair with arms (e.g., wheelchair, bedside commode, etc,.)?: A Little Help needed moving to and from a bed to chair (including a wheelchair)?: A Lot Help needed walking in hospital room?: Total Help needed climbing 3-5 steps with a railing? : Total 6 Click Score: 13    End of Session Equipment Utilized During Treatment: Gait belt Activity Tolerance: Other (comment)(Activity tolerance limited by BP drop with upright activity) Patient left: in bed;with call bell/phone within reach;with family/visitor present Nurse Communication: Mobility status;Other (comment)(BP drop) PT Visit Diagnosis: Other abnormalities of gait and mobility (R26.89);Pain Pain - Right/Left: Left Pain - part of body: Knee     Time: 0837-0900 PT Time Calculation (min) (ACUTE ONLY): 23 min  Charges:  $Therapeutic Exercise: 8-22 mins $Therapeutic Activity: 8-22 mins                    G Codes:       Roney Marion, PT  Acute Rehabilitation Services Pager [phone] Office [phone]    Colletta Maryland 08/03/2017, 9:08 AM

## 2017-08-03 NOTE — Evaluation (Signed)
Occupational Therapy Evaluation Patient Details Name: [name] MRN: [id number] DOB: July 23, 1945 Today's Date: 08/03/2017    History of Present Illness Pt is a 72 y/o female s/p elective L unicompartmental knee replacement. PMH includes CAD, and complete heart block s/p pacemaker placement.    Clinical Impression   Patient is s/p L knee surgery resulting in functional limitations due to the deficits listed below (see OT problem list). Pt currently limited by BP (see vitals below) Patient will benefit from skilled OT acutely to increase independence and safety with ADLS to allow discharge home. Pt needs to demonstrate a tub transfer prior to home. Recommend a seat for tub for home.   Orthostatic BPs  Supine 116/59 (72)  Sitting 115/49 (64)  Sitting after 3 min 115/51 (67)  Standing 108/56  Standing after 3 min 109/51 (63)  Standing after movement 103/52 (63)       Follow Up Recommendations  No OT follow up    Equipment Recommendations  Tub/shower seat    Recommendations for Other Services       Precautions / Restrictions Precautions Precautions: Knee Precaution Booklet Issued: Yes (comment) Precaution Comments: educated on knee extension and no pillow under the knee Restrictions Weight Bearing Restrictions: No LLE Weight Bearing: Weight bearing as tolerated      Mobility Bed Mobility Overal bed mobility: Needs Assistance Bed Mobility: Supine to Sit     Supine to sit: Supervision Sit to supine: Supervision   General bed mobility comments: able to lift L LE without (A) to EOB  Transfers Overall transfer level: Needs assistance Equipment used: Rolling walker (2 wheeled) Transfers: Sit to/from Stand Sit to Stand: Supervision         General transfer comment: cues for hand placement to push up from bed level     Balance                                           ADL either performed or assessed with clinical judgement   ADL  Overall ADL's : Needs assistance/impaired Eating/Feeding: Independent   Grooming: Independent Grooming Details (indicate cue type and reason): required seated at this time due to BP  Upper Body Bathing: Independent   Lower Body Bathing: Minimal assistance Lower Body Bathing Details (indicate cue type and reason): pt requires (A) with toes from spouse if needed     Lower Body Dressing: Minimal assistance;Sit to/from stand Lower Body Dressing Details (indicate cue type and reason): will require (A) with sit <>Stand able to loop underwear. spouse will help with tennis shoes Toilet Transfer: Supervision/safety;RW         Tub/Shower Transfer Details (indicate cue type and reason): has tub shower at home but unable to demonstrate at this time due to BP  Functional mobility during ADLs: Supervision/safety General ADL Comments: pt advised with spouse to call for all transfers at this time due to BP. pt reports getting up to the bathr oom with spouse this AM    Educated patient on knee full extension with return demonstration, never to wash directly on incision site, always use fresh clean linen (one time use then place in laundry), avoid water under bandage and benefits of wrapping dressing, sleeping positioning, avoid putting pillow under knee   Vision         Perception     Praxis      Pertinent Vitals/Pain Pain  Assessment: Faces Pain Score: 5  Faces Pain Scale: Hurts little more Pain Location: L knee  Pain Descriptors / Indicators: Aching;Operative site guarding Pain Intervention(s): Limited activity within patient's tolerance;Monitored during session;Premedicated before session;Repositioned     Hand Dominance Right   Extremity/Trunk Assessment Upper Extremity Assessment Upper Extremity Assessment: Overall WFL for tasks assessed   Lower Extremity Assessment Lower Extremity Assessment: Defer to PT evaluation   Cervical / Trunk Assessment Cervical / Trunk Assessment: Other  exceptions Cervical / Trunk Exceptions: previous back surgery x3   Communication Communication Communication: No difficulties   Cognition Arousal/Alertness: Awake/alert Behavior During Therapy: WFL for tasks assessed/performed Overall Cognitive Status: Within Functional Limits for tasks assessed                                     General Comments  ace wrap with no drainage noted at this time    Exercises Exercises: Total Joint Total Joint Exercises Ankle Circles/Pumps: AROM;Both;20 reps;Seated Quad Sets: AROM;Left;10 reps Heel Slides: AAROM;Left;10 reps Straight Leg Raises: AROM;Left;10 reps Goniometric ROM: approx 2-95 deg   Shoulder Instructions      Home Living Family/patient expects to be discharged to:: Private residence Living Arrangements: Spouse/significant other Available Help at Discharge: Family;Available 24 hours/day Type of Home: House Home Access: Stairs to enter [employer] of Steps: 3 Entrance Stairs-Rails: None Home Layout: One level     Bathroom Shower/Tub: Teacher, early years/pre: Handicapped height     Home Equipment: Environmental consultant - standard          Prior Functioning/Environment Level of Independence: Independent                 OT Problem List: Decreased activity tolerance;Decreased knowledge of precautions;Decreased knowledge of use of DME or AE;Decreased safety awareness;Pain      OT Treatment/Interventions: Self-care/ADL training;Therapeutic activities;Therapeutic exercise;Patient/family education;Balance training    OT Goals(Current goals can be found in the care plan section) Acute Rehab OT Goals Patient Stated Goal: to feel better  OT Goal Formulation: With patient Time For Goal Achievement: 08/17/17 Potential to Achieve Goals: Good  OT Frequency: Min 2X/week   Barriers to D/C:            Co-evaluation              AM-PAC PT "6 Clicks" Daily Activity     Outcome Measure Help  from another person eating meals?: None Help from another person taking care of personal grooming?: None Help from another person toileting, which includes using toliet, bedpan, or urinal?: None Help from another person bathing (including washing, rinsing, drying)?: None Help from another person to put on and taking off regular upper body clothing?: None Help from another person to put on and taking off regular lower body clothing?: A Little 6 Click Score: 23   End of Session Equipment Utilized During Treatment: Rolling walker CPM Left Knee CPM Left Knee: Off Nurse Communication: Mobility status;Precautions  Activity Tolerance: Patient tolerated treatment well Patient left: in chair;with call bell/phone within reach;with family/visitor present  OT Visit Diagnosis: Unsteadiness on feet (R26.81)                Time: 1610-9604 OT Time Calculation (min): 27 min Charges:  OT General Charges $OT Visit: 1 Visit OT Evaluation $OT Eval Moderate Complexity: 1 Mod G-Codes:      Jeri Modena   OTR/L Pager: [phone] Office: [phone] .  Parke Poisson B 08/03/2017, 10:11 AM

## 2017-08-03 NOTE — Progress Notes (Signed)
Physical Therapy Treatment Patient Details Name: [name] MRN: [id number] DOB: 1946/03/11 Today's Date: 08/03/2017    History of Present Illness Pt is a 72 y/o female s/p elective L unicompartmental knee replacement. PMH includes CAD, and complete heart block s/p pacemaker placement.     PT Comments    Continuing work on functional mobility and activity tolerance;  Much improved activity tolerance, Orthostatics negative; Stair training complete; questions solicited and answered; OK for dc home from PT standpoint    08/03/17 1300 08/03/17 1406  Vital Signs  Patient Position (if appropriate) Orthostatic Vitals --   Orthostatic Lying   BP- Lying 131/62 --   Pulse- Lying 93 --   Orthostatic Sitting  BP- Sitting 132/67 130/58  Pulse- Sitting 93 91  Orthostatic Standing at 0 minutes  BP- Standing at 0 minutes 111/90 --   Pulse- Standing at 0 minutes 95 --   Orthostatic Standing at 3 minutes  BP- Standing at 3 minutes 125/70 --   Pulse- Standing at 3 minutes 93 --       Follow Up Recommendations  DC plan and follow up therapy as arranged by surgeon;Supervision for mobility/OOB     Equipment Recommendations  Rolling walker with 5" wheels    Recommendations for Other Services       Precautions / Restrictions Precautions Precautions: Knee Precaution Comments: educated on knee extension and no pillow under the knee Restrictions LLE Weight Bearing: Weight bearing as tolerated    Mobility  Bed Mobility Overal bed mobility: Needs Assistance Bed Mobility: Supine to Sit     Supine to sit: Supervision     General bed mobility comments: able to lift L LE without (A) to EOB  Transfers Overall transfer level: Needs assistance Equipment used: Rolling walker (2 wheeled) Transfers: Sit to/from Stand Sit to Stand: Supervision         General transfer comment: cues for hand placement to push up from bed level   Ambulation/Gait Ambulation/Gait assistance: Min  guard;Supervision Ambulation Distance (Feet): 120 Feet Assistive device: Rolling walker (2 wheeled) Gait Pattern/deviations: Step-through pattern(emerging)     General Gait Details: Cues to self-monitor for activity tolerance, and to activate quad for stance stability   Stairs Stairs: Yes   Stair Management: No rails;Backwards;With walker Number of Stairs: 4 General stair comments: Son present for stair training; Verbal and demo cues for techqniue and sequence; Son gave correct assistance  Wheelchair Mobility    Modified Rankin (Stroke Patients Only)       Balance                                            Cognition Arousal/Alertness: Awake/alert Behavior During Therapy: WFL for tasks assessed/performed Overall Cognitive Status: Within Functional Limits for tasks assessed                                        Exercises      General Comments General comments (skin integrity, edema, etc.): Discussed car transfer      Pertinent Vitals/Pain Pain Assessment: 0-10 Pain Score: 2  Pain Location: L knee  Pain Descriptors / Indicators: Aching;Operative site guarding Pain Intervention(s): Monitored during session    Home Living  Prior Function            PT Goals (current goals can now be found in the care plan section) Acute Rehab PT Goals Patient Stated Goal: to feel better  PT Goal Formulation: With patient Time For Goal Achievement: 08/16/17 Potential to Achieve Goals: Good Progress towards PT goals: Progressing toward goals    Frequency    7X/week      PT Plan Current plan remains appropriate    Co-evaluation              AM-PAC PT "6 Clicks" Daily Activity  Outcome Measure  Difficulty turning over in bed (including adjusting bedclothes, sheets and blankets)?: A Little Difficulty moving from lying on back to sitting on the side of the bed? : A Little Difficulty sitting down  on and standing up from a chair with arms (e.g., wheelchair, bedside commode, etc,.)?: A Little Help needed moving to and from a bed to chair (including a wheelchair)?: A Little Help needed walking in hospital room?: A Little Help needed climbing 3-5 steps with a railing? : A Little 6 Click Score: 18    End of Session Equipment Utilized During Treatment: Gait belt Activity Tolerance: Patient tolerated treatment well Patient left: in chair;with call bell/phone within reach Nurse Communication: Mobility status(ok for dc) PT Visit Diagnosis: Other abnormalities of gait and mobility (R26.89);Pain Pain - Right/Left: Left Pain - part of body: Knee     Time: 1329-1410 PT Time Calculation (min) (ACUTE ONLY): 41 min  Charges:  $Gait Training: 23-37 mins $Therapeutic Activity: 8-22 mins                    G Codes:       Roney Marion, PT  Acute Rehabilitation Services Pager [phone] Office [phone]    Colletta Maryland 08/03/2017, 4:26 PM

## 2017-08-03 NOTE — Discharge Summary (Signed)
Discharge Summary  Patient ID: [name] MRN: [id number] DOB/AGE: [birth year] 72 y.o.  Admit date: 08/02/2017 Discharge date: 08/03/2017  Admission Diagnoses:  Primary osteoarthritis of left knee  Discharge Diagnoses:  Principal Problem:   Primary osteoarthritis of left knee Active Problems:   Aortic valve stenosis   Anxiety state   S/P cardiac pacemaker procedure, 05/23/15, MDT Advisa placed.   Primary osteoarthritis of knee   Past Medical History:  Diagnosis Date  . Anxiety   . Aortic stenosis    mild by 01/13/16 echo at [facility]  . Arthritis    OA- hands, back, knees   . CAD (coronary artery disease)    a. LHC 8/09 - mLAD myocardial bridge, pLCx 22, mRCA 3  //  b. Myoview 5/11 - No ischemia or scar, breast attenuation, EF 67%, low risk  //  c. Lexiscan Myoview 3/17: EF 71%, no ischemia, low risk  . 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 Carotid stenosis    a. Carotid US 7/15 - +plaque, no significant ICA stenosis  . Complete heart block ([place]) 05/23/2015   s/p Pacemaker 11/16  . GERD (gastroesophageal reflux disease)   . Headache    h/o of migraines long time ago  . Heart murmur   . History of echocardiogram    a. Ech 10/16: EF 60-65%, normal wall motion, grade 1 diastolic dysfunction, aortic valve cusp separation reduced, no stenosis, mean gradient 9 mmHg, MAC, mild MR, mild LAE  . Hx of syncope 05/24/2015  . Hyperlipidemia   . PONV (postoperative nausea and vomiting)   . Presence of permanent cardiac pacemaker    MDT PPM implant 05/24/15 Dr. Curt Bears, lead revision 07/10/15  . 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 female who was admitted 08/02/2017 with a diagnosis of Primary osteoarthritis of left knee and went to the operating room on 08/02/2017 and underwent  the above named procedures.    She was given perioperative antibiotics:  Anti-infectives (From admission, onward)   Start     Dose/Rate Route Frequency Ordered Stop   08/02/17 1520  ceFAZolin (ANCEF) IVPB 1 g/50 mL premix     1 g 100 mL/hr over 30 Minutes Intravenous Every 6 hours 08/02/17 1451 08/03/17 0329   08/02/17 0600  ceFAZolin (ANCEF) IVPB 2g/100 mL premix     2 g 200 mL/hr over 30 Minutes Intravenous To [facility]Stay [facility] 08/01/17 0918 08/02/17 0740    .  She was given sequential compression devices, early ambulation, and ASA for DVT prophylaxis.  She benefited maximally from the hospital stay and there were no complications.    Recent vital signs:  Vitals:   08/02/17 2000 08/03/17 0025  BP: 124/64 (!) 111/58  Pulse: 98 97  Resp: 16 18  Temp: 97.8 F (36.6 C) 98 F (36.7 C)  SpO2: 96% 96%    Recent laboratory studies:  Lab Results  Component Value Date   HGB 14.6 07/21/2017   HGB 14.1 07/08/2015   HGB 13.8 05/21/2015   Lab Results  Component Value Date   WBC 6.5 07/21/2017   PLT 192 07/21/2017   Lab Results  Component Value Date   INR 1.07 04/30/2015   Lab Results  Component Value Date   NA 141 07/21/2017   K 4.2 07/21/2017  CL 108 07/21/2017   CO2 22 07/21/2017   BUN 12 07/21/2017   CREATININE 0.70 07/21/2017   GLUCOSE 93 07/21/2017    Discharge Medications:   Allergies as of 08/03/2017      Reactions   Sulfa Antibiotics Rash      Medication List    TAKE these medications   acetaminophen 500 MG tablet Commonly known as:  TYLENOL Take 2 tablets (1,000 mg total) by mouth every 8 (eight) hours for 14 days. For Pain. What changed:    medication strength  how much to take  when to take this  reasons to take this  additional instructions   aspirin EC 325 MG tablet Take 1 tablet (325 mg total) by mouth daily. For 30 days post op for DVT Prophylaxis   azelastine 0.1 % nasal spray Commonly known as:  ASTELIN 2 SPRAYS EACH NOSTRIL  ONCE DAILY AS NEEDED FOR NASAL CONGESTION   baclofen 10 MG tablet Commonly known as:  LIORESAL Take 1 tablet (10 mg total) by mouth 3 (three) times daily as needed for muscle spasms.   BIOTIN 5000 5 MG Caps Generic drug:  Biotin Take 5 mg by mouth daily.   BLACK COHOSH PO Take 1 capsule by mouth daily.   clonazePAM 0.5 MG tablet Commonly known as:  KLONOPIN Take 0.5 mg by mouth daily as needed.   diclofenac 75 MG EC tablet Commonly known as:  VOLTAREN Take 75 mg by mouth daily.   docusate sodium 100 MG capsule Commonly known as:  COLACE Take 1 capsule (100 mg total) by mouth 2 (two) times daily. To prevent constipation while taking pain medication.   ezetimibe 10 MG tablet Commonly known as:  ZETIA Take 10 mg by mouth daily.   famotidine 40 MG tablet Commonly known as:  PEPCID Take 40 mg by mouth daily.   omeprazole 20 MG capsule Commonly known as:  PRILOSEC Take 1 capsule (20 mg total) by mouth daily. 30 days for gastroprotection while taking NSAIDs.   ondansetron 4 MG tablet Commonly known as:  ZOFRAN Take 1 tablet (4 mg total) by mouth every 8 (eight) hours as needed for nausea or vomiting.   OVER THE COUNTER MEDICATION Take by mouth 3 (three) times daily. 1/4 teaspoon of tumeric 1 tablespoon of Olive Oil   oxyCODONE 5 MG immediate release tablet Commonly known as:  ROXICODONE Take 1-2 tablets (5-10 mg total) by mouth every 4 (four) hours as needed for breakthrough pain.   SYSTANE OP Place 1 drop into both eyes 2 (two) times daily.   vitamin E 1000 UNIT capsule Take 1,000 Units by mouth daily.       Diagnostic Studies: Dg Knee Left Port  Result Date: 08/02/2017 CLINICAL DATA:  Osteoarthritis of the left knee. Status post unicompartmental arthroplasty. EXAM: PORTABLE LEFT KNEE - 1-2 VIEW COMPARISON:  None. FINDINGS: The patient has undergone arthroplasty of the medial compartment of the left knee. Prosthetic components appear in good position. Small  marginal osteophytes in the patellofemoral and lateral compartments. Air-fluid level in the suprapatellar recess of the joint. IMPRESSION: Satisfactory postoperative appearance of the left knee. Electronically Signed   By: Lorriane Shire M.D.   On: 08/02/2017 10:35    Disposition: 01-Home or Self Care  Discharge Instructions    Discharge patient   Complete by:  As directed    After therapy sessions   Discharge disposition:  01-Home or Self Care   Discharge patient date:  08/03/2017  Follow-up Information    Renette Butters, MD Follow up.   Specialty:  Orthopedic Surgery Contact information: [place] [place] [address] [phone]            Signed: Prudencio Burly III PA-C 08/03/2017, 7:34 AM

## 2017-08-03 NOTE — Progress Notes (Signed)
Occupational Therapy Treatment Patient Details Name: [name] MRN: [id number] DOB: [birth year] Today's Date: 08/03/2017    History of present illness Pt is a 72 y/o female s/p elective L unicompartmental knee replacement. PMH includes CAD, and complete heart block s/p pacemaker placement.    OT comments  Pt demonstrates adequate level for d/c home at this time. All education is complete   Follow Up Recommendations  No OT follow up    Equipment Recommendations  Tub/shower seat    Recommendations for Other Services      Precautions / Restrictions Precautions Precautions: Knee Precaution Comments: educated on knee extension and no pillow under the knee Restrictions LLE Weight Bearing: Weight bearing as tolerated       Mobility Bed Mobility Overal bed mobility: Needs Assistance Bed Mobility: Supine to Sit     Supine to sit: Supervision     General bed mobility comments: in chair ona rrival  Transfers Overall transfer level: Needs assistance Equipment used: Rolling walker (2 wheeled) Transfers: Sit to/from Stand Sit to Stand: Supervision         General transfer comment: cues for hand placement to push up from bed level     Balance                                           ADL either performed or assessed with clinical judgement   ADL                           Toilet Transfer: Minimal assistance;RW;Ambulation         Tub/Shower Transfer Details (indicate cue type and reason): shower seat Functional mobility during ADLs: Supervision/safety General ADL Comments: pt able to demo tub transfer and educated during session on positioning     Vision       Perception     Praxis      Cognition Arousal/Alertness: Awake/alert Behavior During Therapy: WFL for tasks assessed/performed Overall Cognitive Status: Within Functional Limits for tasks assessed                                           Exercises     Shoulder Instructions       General Comments Discussed car transfer    Pertinent Vitals/ Pain       Pain Assessment: No/denies pain Pain Score: 2  Pain Location: L knee  Pain Descriptors / Indicators: Aching;Operative site guarding Pain Intervention(s): Monitored during session  Home Living                                          Prior Functioning/Environment              Frequency  Min 2X/week        Progress Toward Goals  OT Goals(current goals can now be found in the care plan section)  Progress towards OT goals: Goals met/education completed, patient discharged from OT  Acute Rehab OT Goals Patient Stated Goal: to feel better  OT Goal Formulation: With patient Time For Goal Achievement: 08/17/17 Potential to Achieve Goals: Good ADL Goals Pt Will Perform Tub/Shower Transfer: Tub transfer;shower  seat;ambulating;rolling walker;with supervision  Plan Discharge plan remains appropriate    Co-evaluation                 AM-PAC PT "6 Clicks" Daily Activity     Outcome Measure   Help from another person eating meals?: None Help from another person taking care of personal grooming?: None Help from another person toileting, which includes using toliet, bedpan, or urinal?: None Help from another person bathing (including washing, rinsing, drying)?: None Help from another person to put on and taking off regular upper body clothing?: None Help from another person to put on and taking off regular lower body clothing?: A Little 6 Click Score: 23    End of Session Equipment Utilized During Treatment: Rolling walker  OT Visit Diagnosis: Unsteadiness on feet (R26.81)   Activity Tolerance Patient tolerated treatment well   Patient Left in chair;with call bell/phone within reach;with family/visitor present   Nurse Communication Mobility status;Precautions        Time: 8341-9622 OT Time Calculation (min): 13  min  Charges: OT General Charges $OT Visit: 1 Visit OT Treatments $Self Care/Home Management : 8-22 mins   Jeri Modena   OTR/L Pager: [phone] Office: [phone] .    Parke Poisson B 08/03/2017, 4:45 PM

## 2017-08-03 NOTE — Progress Notes (Signed)
PT in working with pt, this nurse called to room d/t pt c/o nausea and feeling light headed.  Pt hypotensive.  Recheck 1 min later pt normotensive and asymptomatic and returned to baseline.  No acute distress at this time.  AKingRNBSN

## 2017-08-03 NOTE — Care Management Note (Signed)
Case Management Note  Patient Details  Name: [name] MRN: [id number] Date of Birth: Jan 20, 1946  Subjective/Objective:  72 yr old female s/p left total knee arthroplasty.                   Action/Plan: Case manager spoke with patient concerning discharge plan and DME. Patient was preoperatively setup with [facility], no changes, She will have family support at discharge.  Expected Discharge Date:  08/03/17               Expected Discharge Plan:  [place]  In-House Referral:  NA  Discharge planning Services  CM Consult  Post Acute Care Choice:  Durable Medical Equipment, Home Health Choice offered to:  Patient  DME Arranged:  3-N-1, Walker rolling, CPM DME Agency:  [employer]  HH Arranged:  PT [place]:  [facility] [employer]  Status of Service:  Completed, signed off  If discussed at [employer], dates discussed:    Additional Comments:  Ninfa Meeker, RN 08/03/2017, 11:48 AM

## 2017-08-04 ENCOUNTER — Encounter (HOSPITAL_COMMUNITY): Payer: Self-pay | Admitting: Orthopedic Surgery

## 2017-09-28 ENCOUNTER — Other Ambulatory Visit: Payer: Self-pay | Admitting: Family Medicine

## 2017-09-28 DIAGNOSIS — Z1231 Encounter for screening mammogram for malignant neoplasm of breast: Secondary | ICD-10-CM

## 2017-11-04 ENCOUNTER — Ambulatory Visit
Admission: RE | Admit: 2017-11-04 | Discharge: 2017-11-04 | Disposition: A | Discharge status: Home / Self Care | Payer: Medicare Other | Source: Ambulatory Visit | Attending: Family Medicine | Admitting: Family Medicine

## 2017-11-04 DIAGNOSIS — Z1231 Encounter for screening mammogram for malignant neoplasm of breast: Secondary | ICD-10-CM

## 2018-09-26 ENCOUNTER — Other Ambulatory Visit: Payer: Self-pay | Admitting: Family Medicine

## 2018-09-26 DIAGNOSIS — Z1231 Encounter for screening mammogram for malignant neoplasm of breast: Secondary | ICD-10-CM

## 2018-11-17 ENCOUNTER — Ambulatory Visit
Admission: RE | Admit: 2018-11-17 | Discharge: 2018-11-17 | Disposition: A | Discharge status: Home / Self Care | Payer: Medicare Other | Source: Ambulatory Visit | Attending: Family Medicine | Admitting: Family Medicine

## 2018-11-17 ENCOUNTER — Other Ambulatory Visit: Payer: Self-pay

## 2018-11-17 DIAGNOSIS — Z1231 Encounter for screening mammogram for malignant neoplasm of breast: Secondary | ICD-10-CM

## 2018-11-22 ENCOUNTER — Telehealth: Payer: Self-pay | Admitting: Diagnostic Neuroimaging

## 2018-11-22 ENCOUNTER — Encounter: Payer: Self-pay | Admitting: Diagnostic Neuroimaging

## 2018-11-22 ENCOUNTER — Ambulatory Visit: Payer: Medicare Other

## 2018-11-22 NOTE — Telephone Encounter (Signed)
Pt gave consent for video visit.  Pt understands that although there may be some limitations with this type of visit, we will take all precautions to reduce any security or privacy concerns.  Pt understands that this will be treated like an in office visit and we will file with pt's insurance, and there may be a patient responsible charge related to this service. °

## 2018-11-22 NOTE — Telephone Encounter (Signed)
Spoke with patient and updated EMR. 

## 2018-11-22 NOTE — Addendum Note (Signed)
Addended by: Minna Antis on: 11/22/2018 03:54 PM   Modules accepted: Orders

## 2018-11-23 ENCOUNTER — Ambulatory Visit (INDEPENDENT_AMBULATORY_CARE_PROVIDER_SITE_OTHER): Payer: Medicare Other | Admitting: Diagnostic Neuroimaging

## 2018-11-23 ENCOUNTER — Encounter: Payer: Self-pay | Admitting: Diagnostic Neuroimaging

## 2018-11-23 ENCOUNTER — Other Ambulatory Visit: Payer: Self-pay

## 2018-11-23 DIAGNOSIS — R51 Headache: Secondary | ICD-10-CM

## 2018-11-23 DIAGNOSIS — G4486 Cervicogenic headache: Secondary | ICD-10-CM

## 2018-11-23 DIAGNOSIS — M542 Cervicalgia: Secondary | ICD-10-CM

## 2018-11-23 NOTE — Progress Notes (Signed)
[facility]  PATIENT: [name] DOB: 06-12-1946  REFERRING CLINICIAN: Diamantina Monks HISTORY FROM: patient  REASON FOR VISIT: new consult    HISTORICAL  CHIEF COMPLAINT:  Chief Complaint  Patient presents with  . headache, neck pain, numbness    HISTORY OF PRESENT ILLNESS:   73 year old female here for evaluation of headaches, neck pain, tremor, and numbness.  PCP referral received for evaluation of chronic low back pain and sciatica.  However patient tells me that she actually wants evaluation for headache, neck pain, tremor and numbness.  July 25, 2018 patient had onset of headache, generalized weakness from her neck down, affecting her arms and legs.  She describes generalized tension and pressure from her neck radiating to her head.  These feel like a new type of headache.  She saw a neurologist in March 2020 order lab testing and recommended follow-up MRI of the cervical spine, as patient has had history of spinal stenosis in cervical region from 2015.  However patient did not hear back from that clinic and therefore requested second opinion neurologist.  Patient continues to have fatigue, headaches, malaise, neck pain, and has been using gabapentin and Tylenol with mild relief.  Patient has history of migraine headaches from age 41 years old with prodromal visual disturbance of seeing black spots, throbbing headaches, photophobia and phonophobia.  No nausea or vomiting.  Patient has had recurrence of headaches in 2010 and 2018.  Current headaches are different than prior migraines.  Patient has history of pacemaker in 2016.  Apparently this is MRI compatible.   REVIEW OF SYSTEMS: Full 14 system review of systems performed and negative with exception of: As per HPI.  ALLERGIES: Allergies  Allergen Reactions  . Sulfa Antibiotics Rash    HOME MEDICATIONS: Outpatient Medications Prior to Visit  Medication Sig Dispense Refill  . Cholecalciferol  (VITAMIN D3 PO) Take 1,000 Units by mouth daily.    . clonazePAM (KLONOPIN) 0.5 MG tablet Take 0.5 mg by mouth daily as needed.     . diclofenac (VOLTAREN) 75 MG EC tablet Take 75 mg by mouth daily.    Marland Kitchen ezetimibe (ZETIA) 10 MG tablet Take 10 mg by mouth daily.    . famotidine (PEPCID) 40 MG tablet Take 40 mg by mouth daily.    Marland Kitchen gabapentin (NEURONTIN) 300 MG capsule TK 1-3 CS PO UP TO TID    . Omega-3 Fatty Acids (FISH OIL PO) Take by mouth.    Vladimir Faster Glycol-Propyl Glycol (SYSTANE OP) Place 1 drop into both eyes 2 (two) times daily.     Marland Kitchen tiZANidine (ZANAFLEX) 4 MG tablet 4 mg 3 (three) times daily.     No facility-administered medications prior to visit.     PAST MEDICAL HISTORY: Past Medical History:  Diagnosis Date  . AAA (abdominal aortic aneurysm) ([place])   . Anxiety   . Aortic stenosis    mild by 01/13/16 echo at [facility]  . Arthritis    OA- hands, back, knees   . CAD (coronary artery disease)    a. LHC 8/09 - mLAD myocardial bridge, pLCx 35, mRCA 24  //  b. Myoview 5/11 - No ischemia or scar, breast attenuation, EF 67%, low risk  //  c. Lexiscan Myoview 3/17: EF 71%, no ischemia, low risk  . 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 Carotid stenosis    a. Carotid US 7/15 - +plaque, no significant ICA stenosis  . Chronic pain  low back  . Complete heart block ([place]) 05/23/2015   s/p Pacemaker 11/16  . GERD (gastroesophageal reflux disease)   . Headache    h/o of migraines long time ago  . Heart murmur   . History of echocardiogram    a. Ech 10/16: EF 60-65%, normal wall motion, grade 1 diastolic dysfunction, aortic valve cusp separation reduced, no stenosis, mean gradient 9 mmHg, MAC, mild MR, mild LAE  . Hx of syncope 05/24/2015  . Hyperlipidemia   . Migraines   . PONV (postoperative nausea and vomiting)   . Presence of permanent cardiac pacemaker    MDT PPM implant 05/24/15 Dr. Curt Bears, lead revision 07/10/15  . 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 ABDOMINAL HYSTERECTOMY    . APPENDECTOMY    . BACK SURGERY  2009   x3 surgeries, last one - fusion   . BREAST EXCISIONAL BIOPSY Left 2008   benign  . CARDIAC CATHETERIZATION  02/22/2008   no significant CAD, 50% narrowing of proximal mid-dom. right Coronary artery. medical therapy along with antiplatlet therapy   . EP IMPLANTABLE DEVICE N/A 05/23/2015   Procedure: Pacemaker Implant;  Surgeon: Will Meredith Leeds, MD;  Location: [place] [facility];  Service: Cardiovascular;  Laterality: N/A;  . EP IMPLANTABLE DEVICE N/A 07/10/2015   Procedure: Lead Revision Pacemaker;  Surgeon: Will Meredith Leeds, MD;  Location: [place] [facility];  Service: Cardiovascular;  Laterality: N/A;  . INSERT / REPLACE / REMOVE PACEMAKER  10/2015   last one, done at [facility]   . NM MYOCAR PERF WALL MOTION  11/03/2009   protocol: Pers[place], no evidence of ischemia/infarct, post EF67%  . PARTIAL KNEE ARTHROPLASTY Left 08/02/2017   Procedure: UNICOMPARTMENTAL KNEE;  Surgeon: Renette Butters, MD;  Location: [place];  Service: Orthopedics;  Laterality: Left;  . REPLACEMENT UNICONDYLAR JOINT KNEE Left 08/02/2017  . TONSILLECTOMY    . TRANSTHORACIC ECHOCARDIOGRAM  11/12/2010   EF=>55%, moderate calcification of aortic valve leaflets, no MVP seen, normal LV thickness     FAMILY HISTORY: Family History  Problem Relation Age of Onset  . Cancer Mother   . Rheum arthritis Mother   . Cancer Father        leukemia  . Heart disease Father   . Gout Father   . Hypertension Sister   . Diabetes Brother   . Hyperlipidemia Brother   . Gout Brother   . Hypertension Brother   . Heart disease Maternal Grandmother   . Diabetes Maternal Grandmother   . Diabetes Maternal Grandfather   . Heart disease Maternal Grandfather   . Tuberculosis Paternal Grandmother   . Stroke Paternal Grandfather   . Diabetes Brother    . Hyperlipidemia Brother   . Heart disease Brother   . Hyperlipidemia Son   . Diabetes Maternal Aunt     SOCIAL HISTORY: Social History   Socioeconomic History  . Marital status: Widowed    Spouse name: Not on file  . Number of children: 1  . Years of education: Not on file  . Highest education level: Bachelor's degree (e.g., BA, AB, BS)  Occupational History    Comment: retired Pharmacist, hospital x 32 years   Social Needs  . Financial resource strain: Not on file  . Food insecurity:    Worry: Not on file    Inability: Not on file  . Transportation needs:  Medical: Not on file    Non-medical: Not on file  Tobacco Use  . Smoking status: Former Smoker    Packs/day: 1.00    Years: 30.00    Pack years: 30.00    Types: Cigarettes    Last attempt to quit: 08/05/2010    Years since quitting: 8.3  . Smokeless tobacco: Never Used  . Tobacco comment: QUIT SMOKING IN 2013  Substance and Sexual Activity  . Alcohol use: Yes    Comment: 1 glass ( wine) - not even weekly  . Drug use: No  . Sexual activity: Not on file  Lifestyle  . Physical activity:    Days per week: Not on file    Minutes per session: Not on file  . Stress: Not on file  Relationships  . Social connections:    Talks on phone: Not on file    Gets together: Not on file    Attends religious service: Not on file    Active member of club or organization: Not on file    Attends meetings of clubs or organizations: Not on file    Relationship status: Not on file  . Intimate partner violence:    Fear of current or ex partner: Not on file    Emotionally abused: Not on file    Physically abused: Not on file    Forced sexual activity: Not on file  Other Topics Concern  . Not on file  Social History Narrative   Lives alone   Caffeine- coffee, 2 cups daily     PHYSICAL EXAM   VIDEO EXAM  GENERAL EXAM/CONSTITUTIONAL:  Vitals: There were no vitals filed for this visit.  There is no height or weight on file to  calculate BMI. Wt Readings from Last 3 Encounters:  07/21/17 188 lb 3.2 oz (85.4 kg)  09/22/15 183 lb 6.4 oz (83.2 kg)  09/12/15 182 lb 12.8 oz (82.9 kg)     Patient is in no distress; well developed, nourished and groomed; neck is supple   NEUROLOGIC: MENTAL STATUS:  No flowsheet data found.  awake, alert, oriented to person, place and time  recent and remote memory intact  normal attention and concentration  language fluent, comprehension intact, naming intact  fund of knowledge appropriate  CRANIAL NERVE:   2nd, 3rd, 4th, 6th - visual fields full to confrontation, extraocular muscles intact, no nystagmus  5th - facial sensation symmetric  7th - facial strength symmetric  8th - hearing intact  11th - shoulder shrug symmetric  12th - tongue protrusion midline  MOTOR:   NO TREMOR; NO DRIFT IN BUE  SENSORY:   normal and symmetric to light touch  COORDINATION:   fine finger movements normal     DIAGNOSTIC DATA (LABS, IMAGING, TESTING) - I reviewed patient records, labs, notes, testing and imaging myself where available.  Lab Results  Component Value Date   WBC 6.5 07/21/2017   HGB 14.6 07/21/2017   HCT 43.8 07/21/2017   MCV 92.0 07/21/2017   PLT 192 07/21/2017      Component Value Date/Time   NA 141 07/21/2017 1003   K 4.2 07/21/2017 1003   CL 108 07/21/2017 1003   CO2 22 07/21/2017 1003   GLUCOSE 93 07/21/2017 1003   BUN 12 07/21/2017 1003   CREATININE 0.70 07/21/2017 1003   CREATININE 0.66 07/08/2015 1350   CALCIUM 9.2 07/21/2017 1003   PROT 5.6 (L) 04/30/2015 0313   ALBUMIN 3.3 (L) 04/30/2015 0313   AST 24  04/30/2015 0313   ALT 14 04/30/2015 0313   ALKPHOS 32 (L) 04/30/2015 0313   BILITOT 0.5 04/30/2015 0313   GFRNONAA >60 07/21/2017 1003   GFRAA >60 07/21/2017 1003   Lab Results  Component Value Date   CHOL 183 01/14/2014   HDL 65 01/14/2014   LDLCALC 94 01/14/2014   TRIG 118 01/14/2014   CHOLHDL 2.8 01/14/2014   No  results found for: HGBA1C No results found for: VITAMINB12 Lab Results  Component Value Date   TSH 0.641 01/14/2014    04/29/15 CT head [I reviewed images myself and agree with interpretation. -VRP]  - Normal noncontrast CT head for age.  09/08/18 CT head  - No acute intracranial abnormality.  03/14/14 MRI cervical spine - Multilevel DDD and facet arthropathy.Moderate spinal canal stenosis and mild cord compression C4-C5 and C5-C6.Severe bilateral foraminal stenosis C5-C6.  06/04/96 MRI lumbar spine 1. DIFFUSE DISC DEGENERATION AND BULGING AT L5-S1 WITH BILATERAL FORAMINAL STENOSES, GREATER ON THE LEFT, AND WITH CONTACT OF THE BULGING DISC MATERIAL WITH THE VENTRAL SURFACES OF BOTH S1 NERVE ROOT SLEEVES. 2. CENTRAL FOCAL DISC HERNIATION AT L3-4. 3. MILD DISC DEGENERATION AT L4-5.    ASSESSMENT AND PLAN  73 y.o. year old female here with neck pain, headache, numbness weakness arms legs, tremors, may represent progression of degenerative cervical spine disease.  Dx:  1. Neck pain   2. Cervicogenic headache    Virtual Visit via Video Note  I connected with Diana Palau Tangen on 11/23/18 at  9:00 AM EDT by a video enabled telemedicine application and verified that I am speaking with the correct person using two identifiers.  Location: Patient: home  Provider: office   I discussed the limitations of evaluation and management by telemedicine and the availability of in person appointments. The patient expressed understanding and agreed to proceed.  I discussed the assessment and treatment plan with the patient. The patient was provided an opportunity to ask questions and all were answered. The patient agreed with the plan and demonstrated an understanding of the instructions.   The patient was advised to call back or seek an in-person evaluation if the symptoms worsen or if the condition fails to improve as anticipated.  I provided 35 minutes of non-face-to-face time during  this encounter.   PLAN:  Second opinion evaluation --> cervical spine disease / cervicogenic headache / arm numbness / arm tremors (? Cervical spinal stenosis from 2015 may have worsened) - agree with MRI cervical spine --> per patient, she would like MRI done at [facility] in coordination with her cardiologist for supervision of her pacemaker; in addition, if she were to require surgery, she would prefer it to be done at [facility]. may consider referral to [facility] neurology or neurosurgery for further testing and evaluation - continue gabapentin 314m daily + tylenol as needed  Return for return to PCP.    VPenni Bombard MD 50/35/5974 91:63AM Certified in Neurology, Neurophysiology and Neuroimaging  [facility] [address] SFall RiverGTustin [place] [phone]

## 2019-02-03 ENCOUNTER — Encounter (HOSPITAL_COMMUNITY): Payer: Self-pay | Admitting: Internal Medicine

## 2019-02-03 ENCOUNTER — Other Ambulatory Visit: Payer: Self-pay

## 2019-02-03 ENCOUNTER — Emergency Department (HOSPITAL_COMMUNITY): Payer: Medicare Other

## 2019-02-03 ENCOUNTER — Observation Stay (HOSPITAL_COMMUNITY)
Admission: EM | Admit: 2019-02-03 | Discharge: 2019-02-04 | Disposition: A | Discharge status: Home / Self Care | Payer: Medicare Other | Attending: Internal Medicine | Admitting: Internal Medicine

## 2019-02-03 DIAGNOSIS — N2889 Other specified disorders of kidney and ureter: Secondary | ICD-10-CM | POA: Diagnosis not present

## 2019-02-03 DIAGNOSIS — I2583 Coronary atherosclerosis due to lipid rich plaque: Secondary | ICD-10-CM

## 2019-02-03 DIAGNOSIS — Z950 Presence of cardiac pacemaker: Secondary | ICD-10-CM | POA: Diagnosis present

## 2019-02-03 DIAGNOSIS — N289 Disorder of kidney and ureter, unspecified: Secondary | ICD-10-CM | POA: Diagnosis not present

## 2019-02-03 DIAGNOSIS — Z20828 Contact with and (suspected) exposure to other viral communicable diseases: Secondary | ICD-10-CM | POA: Diagnosis not present

## 2019-02-03 DIAGNOSIS — I2510 Atherosclerotic heart disease of native coronary artery without angina pectoris: Secondary | ICD-10-CM | POA: Diagnosis present

## 2019-02-03 DIAGNOSIS — R079 Chest pain, unspecified: Secondary | ICD-10-CM | POA: Diagnosis not present

## 2019-02-03 DIAGNOSIS — E785 Hyperlipidemia, unspecified: Secondary | ICD-10-CM | POA: Diagnosis not present

## 2019-02-03 DIAGNOSIS — Z79899 Other long term (current) drug therapy: Secondary | ICD-10-CM | POA: Insufficient documentation

## 2019-02-03 DIAGNOSIS — Z87891 Personal history of nicotine dependence: Secondary | ICD-10-CM

## 2019-02-03 DIAGNOSIS — R0789 Other chest pain: Secondary | ICD-10-CM | POA: Diagnosis not present

## 2019-02-03 LAB — BASIC METABOLIC PANEL
Anion gap: 10 (ref 5–15)
BUN: 17 mg/dL (ref 8–23)
CO2: 23 mmol/L (ref 22–32)
Calcium: 9.5 mg/dL (ref 8.9–10.3)
Chloride: 104 mmol/L (ref 98–111)
Creatinine, Ser: 0.67 mg/dL (ref 0.44–1.00)
GFR calc Af Amer: >60 mL/min (ref >60)
GFR calc non Af Amer: >60 mL/min (ref >60)
Glucose, Bld: 104 mg/dL — ABNORMAL HIGH (ref 70–99)
Potassium: 3.9 mmol/L (ref 3.5–5.1)
Sodium: 137 mmol/L (ref 135–145)

## 2019-02-03 LAB — LIPID PANEL
Cholesterol: 190 mg/dL (ref 0–200)
HDL: 53 mg/dL (ref >40)
LDL Cholesterol: 120 mg/dL — ABNORMAL HIGH (ref 0–99)
Total CHOL/HDL Ratio: 3.6 RATIO
Triglycerides: 83 mg/dL (ref <150)
VLDL: 17 mg/dL (ref 0–40)

## 2019-02-03 LAB — HEPATIC FUNCTION PANEL
ALT: 25 U/L (ref 0–44)
AST: 22 U/L (ref 15–41)
Albumin: 4 g/dL (ref 3.5–5.0)
Alkaline Phosphatase: 50 U/L (ref 38–126)
Bilirubin, Direct: 0.2 mg/dL (ref 0.0–0.2)
Indirect Bilirubin: 0.3 mg/dL (ref 0.3–0.9)
Total Bilirubin: 0.5 mg/dL (ref 0.3–1.2)
Total Protein: 6.6 g/dL (ref 6.5–8.1)

## 2019-02-03 LAB — URINALYSIS, ROUTINE W REFLEX MICROSCOPIC
Bacteria, UA: NONE SEEN
Bilirubin Urine: NEGATIVE
Glucose, UA: NEGATIVE mg/dL
Hgb urine dipstick: NEGATIVE
Ketones, ur: NEGATIVE mg/dL
Nitrite: NEGATIVE
Protein, ur: NEGATIVE mg/dL
Specific Gravity, Urine: 1.006 (ref 1.005–1.030)
pH: 6 (ref 5.0–8.0)

## 2019-02-03 LAB — CBC WITH DIFFERENTIAL/PLATELET
Abs Immature Granulocytes: 0.03 10*3/uL (ref 0.00–0.07)
Basophils Absolute: 0.1 10*3/uL (ref 0.0–0.1)
Basophils Relative: 1 %
Eosinophils Absolute: 0.1 10*3/uL (ref 0.0–0.5)
Eosinophils Relative: 1 %
HCT: 42.5 % (ref 36.0–46.0)
Hemoglobin: 14.3 g/dL (ref 12.0–15.0)
Immature Granulocytes: 0 %
Lymphocytes Relative: 22 %
Lymphs Abs: 2.1 10*3/uL (ref 0.7–4.0)
MCH: 30.4 pg (ref 26.0–34.0)
MCHC: 33.6 g/dL (ref 30.0–36.0)
MCV: 90.2 fL (ref 80.0–100.0)
Monocytes Absolute: 0.8 10*3/uL (ref 0.1–1.0)
Monocytes Relative: 8 %
Neutro Abs: 6.5 10*3/uL (ref 1.7–7.7)
Neutrophils Relative %: 68 %
Platelets: 205 10*3/uL (ref 150–400)
RBC: 4.71 MIL/uL (ref 3.87–5.11)
RDW: 14.5 % (ref 11.5–15.5)
WBC: 9.5 10*3/uL (ref 4.0–10.5)
nRBC: 0 % (ref 0.0–0.2)

## 2019-02-03 LAB — TROPONIN I (HIGH SENSITIVITY)
Troponin I (High Sensitivity): 3 ng/L (ref <18)
Troponin I (High Sensitivity): 5 ng/L (ref <18)

## 2019-02-03 LAB — HEMOGLOBIN A1C
Hgb A1c MFr Bld: 5.5 % (ref 4.8–5.6)
Mean Plasma Glucose: 111.15 mg/dL

## 2019-02-03 LAB — TSH: TSH: 0.458 u[IU]/mL (ref 0.350–4.500)

## 2019-02-03 LAB — SARS CORONAVIRUS 2 BY RT PCR (HOSPITAL ORDER, PERFORMED IN ~~LOC~~ HOSPITAL LAB): SARS Coronavirus 2: NEGATIVE

## 2019-02-03 LAB — LIPASE, BLOOD: Lipase: 34 U/L (ref 11–51)

## 2019-02-03 MED ORDER — SODIUM CHLORIDE 0.9 % IV BOLUS
500.0000 mL | Freq: Once | INTRAVENOUS | Status: AC
Start: 2019-02-03 — End: 2019-02-03
  Administered 2019-02-03: 500 mL via INTRAVENOUS

## 2019-02-03 MED ORDER — ENOXAPARIN SODIUM 40 MG/0.4ML ~~LOC~~ SOLN: 40.0000 mg | SUBCUTANEOUS | Status: DC

## 2019-02-03 MED ORDER — ACETAMINOPHEN 325 MG PO TABS: 650.0000 mg | ORAL_TABLET | ORAL | Status: DC | PRN

## 2019-02-03 MED ORDER — ONDANSETRON HCL 4 MG/2ML IJ SOLN: 4.0000 mg | Freq: Four times a day (QID) | INTRAMUSCULAR | Status: DC | PRN

## 2019-02-03 MED ORDER — EZETIMIBE 10 MG PO TABS
10.0000 mg | ORAL_TABLET | Freq: Every day | ORAL | Status: DC
  Administered 2019-02-04: 10 mg via ORAL
  Filled 2019-02-03: qty 1

## 2019-02-03 MED ORDER — RAMELTEON 8 MG PO TABS
8.0000 mg | ORAL_TABLET | Freq: Every day | ORAL | Status: DC
  Administered 2019-02-04: 8 mg via ORAL
  Filled 2019-02-03: qty 1

## 2019-02-03 MED ORDER — GABAPENTIN 300 MG PO CAPS: 300.0000 mg | ORAL_CAPSULE | Freq: Every evening | ORAL | Status: DC

## 2019-02-03 MED ORDER — IOHEXOL 350 MG/ML SOLN
100.0000 mL | Freq: Once | INTRAVENOUS | Status: AC | PRN
  Administered 2019-02-03: 100 mL via INTRAVENOUS

## 2019-02-03 MED ORDER — FAMOTIDINE 20 MG PO TABS
40.0000 mg | ORAL_TABLET | Freq: Every day | ORAL | Status: DC
  Administered 2019-02-04: 40 mg via ORAL
  Filled 2019-02-03: qty 2

## 2019-02-03 NOTE — H&P (Signed)
Consult Note   [name] ZOX:[id number] DOB: [birth year] DOA: 02/03/2019  PCP: Chesley Noon, MD Consultants:  [place] - cardiology at [facility] Patient coming from:  Home - lives with alone; NOK: Campbell Lerner (boyfriend), [phone], [phone]; or son Patrick Jupiter)  Chief Complaint: Chest pain  HPI: [name] is a 73 y.o. female with medical history significant of pacemaker placement; HLD; remote uterine cancer; and CAD presenting with chest pain.  She reports having pain in her chest for the last few days.  It is a constant ache.  Thursday AM, it woke her about 2AM.  She took Tylenol and went back to sleep and it woke her up again about 4AM.  She hasn't had much sleep since.  She has had diarrhea and abdominal pain.  She decided today to go in to be seen.  The pain is medial of her left lower breast, just to the right of the lower part of her pacemaker incision.  Her legs are weak and her arms and elbows are weak.  No n/v.  No SOB.  No COVID contacts and she is careful about it.  Her son lives in [place] and was here visiting last week.     ED Course:  Chest pain.  Reported h/o AAA - does not have on today's CTA.  Heart score elevated, concerning story, recommend obs.  Pain improved with rest.  Incidental renal mass noted on CTA, needs outpatient evaluation.  Review of Systems: As per HPI; otherwise review of systems reviewed and negative.   Ambulatory Status:  Ambulates without assistance  Past Medical History:  Diagnosis Date   AAA (abdominal aortic aneurysm) (HCC)    Anxiety    Aortic stenosis    mild by 01/13/16 echo at [facility]   Arthritis    OA- hands, back, knees    CAD (coronary artery disease)    a. LHC 8/09 - mLAD myocardial bridge, pLCx 67, mRCA 31  //  b. Myoview 5/11 - No ischemia or scar, breast attenuation, EF 67%, low risk  //  c. Lexiscan Myoview 3/17: EF 71%, no ischemia, low risk   Cancer ([place]) 1978   uterine  CA   Carotid stenosis    a. 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 Ech 10/16: EF 60-65%, normal wall motion, grade 1 diastolic dysfunction, aortic valve cusp separation reduced, no stenosis, mean gradient 9 mmHg, MAC, mild MR, mild LAE   Hx of syncope 05/24/2015   Hyperlipidemia    Migraines    PONV (postoperative nausea and vomiting)    Presence of permanent cardiac pacemaker    MDT PPM implant 05/24/15 Dr. 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 right Coronary artery. medical therapy along with antiplatlet therapy  EP IMPLANTABLE DEVICE N/A 05/23/2015   Procedure: Pacemaker Implant;  Surgeon: Will Meredith Leeds, MD;  Location: [facility];  Service: Cardiovascular;  Laterality: N/A;   EP IMPLANTABLE DEVICE N/A 07/10/2015   Procedure: Lead Revision Pacemaker;  Surgeon: Will Meredith Leeds, MD;  Location: [facility];  Service: Cardiovascular;  Laterality: N/A;   INSERT / REPLACE / REMOVE PACEMAKER  10/2015   last one, done at [place]  11/03/2009   protocol: Persantine, no evidence of ischemia/infarct, post EF67%   PARTIAL KNEE ARTHROPLASTY Left 08/02/2017   Procedure: UNICOMPARTMENTAL KNEE;  Surgeon: Renette Butters, MD;  Location: [facility];  Service: Orthopedics;  Laterality: Left;   REPLACEMENT UNICONDYLAR JOINT KNEE Left  08/02/2017   TONSILLECTOMY     TRANSTHORACIC ECHOCARDIOGRAM  11/12/2010   EF=>55%, moderate calcification of aortic valve leaflets, no MVP seen, normal LV thickness     Social History   Socioeconomic History   Marital status: Widowed    Spouse name: Not on file   Number of children: 1   Years of education: Not on file   Highest education level: Bachelor's degree (e.g., BA, AB, BS)  Occupational History    Comment: retired Pharmacist, hospital x 32 years   Scientist, product/process development strain: Not on file   Food insecurity    Worry: Not on file    Inability: Not on Lexicographer needs    Medical: Not on file    Non-medical: Not on file  Tobacco Use   Smoking status: Former Smoker    Packs/day: 1.00    Years: 30.00    Pack years: 30.00    Types: Cigarettes    Quit date: 08/05/2010    Years since quitting: 8.5   Smokeless tobacco: Never Used   Tobacco comment: QUIT SMOKING IN 2013  Substance and Sexual Activity   Alcohol use: Yes    Comment: 1 glass ( wine) - not even weekly   Drug use: No   Sexual activity: Not on file  Lifestyle   Physical activity    Days per week: Not on file    Minutes per session: Not on file   Stress: Not on file  Relationships   Social connections    Talks on phone: Not on file    Gets together: Not on file    Attends religious service: Not on file    Active member of club or organization: Not on file    Attends meetings of clubs or organizations: Not on file    Relationship status: Not on file   Intimate partner violence    Fear of current or ex partner: Not on file    Emotionally abused: Not on file    Physically abused: Not on file    Forced sexual activity: Not on file  Other Topics Concern   Not on file  Social History Narrative   Lives alone   Caffeine- coffee, 2 cups daily    Allergies  Allergen Reactions   Sulfa Antibiotics Rash    Family History  Problem Relation Age of Onset   Cancer Mother     Rheum arthritis Mother    Cancer Father        leukemia   Heart disease Father    Gout Father    Hypertension Sister    Diabetes Brother    Hyperlipidemia Brother    Gout Brother    Hypertension  Brother    Heart disease Maternal Grandmother    Diabetes Maternal Grandmother    Diabetes Maternal Grandfather    Heart disease Maternal Grandfather    Tuberculosis Paternal Grandmother    Stroke Paternal Grandfather    Diabetes Brother    Hyperlipidemia Brother    Heart disease Brother    Hyperlipidemia Son    Diabetes Maternal Aunt     Prior to Admission medications   Medication Sig Start Date End Date Taking? Authorizing Provider  acetaminophen (TYLENOL) 500 MG tablet Take 500 mg by mouth daily with breakfast.    Yes [provider]  Cholecalciferol (VITAMIN D-3) 25 MCG (1000 UT) CAPS Take 1,000 Units by mouth daily.   Yes [provider]  ezetimibe (ZETIA) 10 MG tablet Take 10 mg by mouth daily.   Yes [provider]  famotidine (PEPCID) 40 MG tablet Take 40 mg by mouth daily.   Yes [provider]  gabapentin (NEURONTIN) 300 MG capsule Take 300 mg by mouth every evening.  08/04/18  Yes [provider]  Polyethyl Glycol-Propyl Glycol (SYSTANE OP) Place 1 drop into both eyes 3 (three) times daily as needed (for dry eyes).    Yes [provider]    Physical Exam: Vitals:   02/03/19 1205 02/03/19 1211 02/03/19 1701  BP: 119/67  (!) 149/87  Pulse: 71  71  Resp: 11  17  Temp: 97.9 F (36.6 C)    TempSrc: Oral    SpO2: 98%  100%  Weight:  85.3 kg   Height:  _0  (1.651 m)       General:  Appears calm and comfortable and is NAD  Eyes:  PERRL, EOMI, normal lids, iris  ENT:  grossly normal hearing, lips & tongue, mmm  Neck:  no LAD, masses or thyromegaly  Cardiovascular:  RRR, no m/r/g. No LE edema.  +significant diffuse chest wall TTP that may reproduce the same pain she has been having the last few  days.  Respiratory:   CTA bilaterally with no wheezes/rales/rhonchi.  Normal respiratory effort.  Abdomen:  soft, NT, ND, NABS  Skin:  no rash or induration seen on limited exam  Musculoskeletal:  grossly normal tone BUE/BLE, good ROM, no bony abnormality  Psychiatric:  grossly normal mood and affect, speech fluent and appropriate, AOx3  Neurologic:  CN 2-12 grossly intact, moves all extremities in coordinated fashion    Radiological Exams on Admission: Dg Chest 1 View  Result Date: 02/03/2019 CLINICAL DATA:  Central and left chest pain for 2 days. EXAM: CHEST  1 VIEW COMPARISON:  PA and lateral chest 07/11/2015. FINDINGS: Lungs are clear. Heart size is normal. No pneumothorax or pleural fluid. Pacing device is in place. No acute bony abnormality. IMPRESSION: No acute disease. Electronically Signed   By: Inge Rise M.D.   On: 02/03/2019 12:47   Ct Angio Chest/abd/pel For Dissection W And/or Wo Contrast  Result Date: 02/03/2019 CLINICAL DATA:  Diaphoretic. Abdominal pain 4 days. Concern for aortic dissection. EXAM: CT ANGIOGRAPHY CHEST, ABDOMEN AND PELVIS TECHNIQUE: Multidetector CT imaging through the chest, abdomen and pelvis was performed using the standard protocol during bolus administration of intravenous contrast. Multiplanar reconstructed images and MIPs were obtained and reviewed to evaluate the vascular anatomy. CONTRAST:  112m OMNIPAQUE IOHEXOL 350 MG/ML SOLN COMPARISON:  None. FINDINGS: CTA CHEST FINDINGS Cardiovascular: Noncontrast CT images of the chest demonstrate no intramural hematoma within the thoracic aorta. contrast enhanced series demonstrates no aortic dissection, ulceration or aneurysm.  The ascending aorta is normal diameter at 33 mm. Typical 3 great vessel anatomy. Scattered intimal calcifications in the thoracic aorta. Pacemaker in place with cardiac leads in the RIGHT heart. Scattered coronary calcifications. Mediastinum/Nodes: No axillary or supraclavicular  adenopathy. No mediastinal hilar adenopathy. Esophagus normal. Trachea appears normal. Lungs/Pleura: Lungs are clear.  Airways normal. Musculoskeletal: No acute osseous abnormality. Review of the MIP images confirms the above findings. CTA ABDOMEN AND PELVIS FINDINGS VASCULAR Aorta: No dissection or aneurysm of the abdominal aorta. Celiac: Widely patent SMA: Widely patent. Replaced RIGHT hepatic artery originates from the SMA. Renals: Single bilateral patent renal arteries - patent IMA: Patent Inflow: Normal Veins: Normal Review of the MIP images confirms the above findings. NON-VASCULAR Hepatobiliary: No focal hepatic lesion. No biliary duct dilatation. Gallbladder is normal. Common bile duct is normal. Pancreas: Pancreas is normal. No ductal dilatation. No pancreatic inflammation. Spleen: Normal spleen Adrenals/urinary tract: Adrenal glands are normal. Kidneys enhance symmetrically. Within the cortex of the LEFT mid kidney is partially exophytic enhancing solid lesion measuring 2.4 x 2.0 by 2.2 cm (image 129/6) Stomach/Bowel: Stomach, small bowel, appendix, and cecum are normal. The colon and rectosigmoid colon are normal. Vascular/Lymphatic: No abdominal lymphadenopathy. Reproductive: Post hysterectomy Other: No free fluid. Musculoskeletal: Posterior lumbar fusion. Degenerative sclerosis in the upper lumbar spine. 5 cm sclerotic lesion of the RIGHT iliac bone is presumably postsurgical. Review of the MIP images confirms the above findings. IMPRESSION: Chest Impression: 1. No evidence of aortic dissection or aneurysm. 2. Coronary artery calcification and Aortic Atherosclerosis (ICD10-I70.0). Abdomen / Pelvis Impression: 1. No abdominal aortic aneurysm or dissection. 2. 5 cm sclerotic lesion in the RIGHT iliac bone presumably related to prior bone harvesting. 3. Enhancing lesion in the mid LEFT kidney is consistent with a solid renal neoplasm (benign or malignant). Recommend non emergent urology consultation.  Findings conveyed toBRANDON MORELLI on 02/03/2019  at14:21. Electronically Signed   By: Suzy Bouchard M.D.   On: 02/03/2019 14:27    EKG: Independently reviewed.  NSR with rate 63;paced rhythm with no evidence of acute ischemia   Labs on Admission: I have personally reviewed the available labs and imaging studies at the time of the admission.  Pertinent labs:   Glucose 104 HS troponin 3 Normal CBC UA: trace LE COVID pending   Assessment/Plan Principal Problem:   Chest pain Active Problems:   CAD (coronary artery disease)   S/P cardiac pacemaker procedure, 05/23/15, MDT Ad[place]a placed.   Dyslipidemia   Left renal mass    Chest pain -Patient with left chest pain that has been present continuously for several days, with reproducible TTP -She does have a h/o repeated pacemaker placements and removals and appears to have some chronic chest wall discomfort resulting -Overall, low suspicion for ACS -CXR unremarkable.   -HS troponin negative x 2. -EKG not indicative of acute ischemia; cardiology has reviewed. -Patient has been seen by cardiology and they do not recommend further ischemic evaluation at this time. -Screening A1c, TSH, lipid testing were ordered for screening. -Will observe overnight and anticipate d/c first thing in AM.  HLD -Continue Zetia  Left Renal Mass -Incidental finding on CTA today -Needs outpatient evaluation  COVID testing -Routine COVID testing was ordered for this patient and pending. -However, she does report recent diarrhea, some vague diffuse pains/myalgias, and weakness. -Her son visited from [facility] week before last and stayed with her. -It would not be out of the realm of possibilities that she has COVID infection.  DVT prophylaxis: Lovenox  Code Status:  FULL - confirmed with patient Family Communication: Her boyfriend was present for the first part of the evaluation Disposition Plan:  Home once clinically improved Consults  called: Cardiology  Admission status: It is my clinical opinion that referral for OBSERVATION is reasonable and necessary in this patient based on the above information provided. The aforementioned taken together are felt to place the patient at high risk for further clinical deterioration. However it is anticipated that the patient may be medically stable for discharge from the hospital within 24 to 48 hours.     Karmen Bongo MD [facility]   How to contact the [facility] Attending or Consulting provider [place] or covering provider during after hours [place], for this patient?  1. Check the care team in [facility] and look for a) attending/consulting [facility] provider listed and b) the [facility] team listed 2. Log into [email or web address] and use 's universal password to access. If you do not have the password, please contact the hospital operator. 3. Locate the [facility] provider you are looking for under [facility] and page to a number that you can be directly reached. 4. If you still have difficulty reaching the provider, please page the [facility] (Director on Call) for the Hospitalists listed on amion for assistance.   02/03/2019, 5:34 PM

## 2019-02-03 NOTE — ED Notes (Signed)
Attempted to call report.  Nurse unavailable. 

## 2019-02-03 NOTE — ED Notes (Signed)
Patients fiance at bedside.

## 2019-02-03 NOTE — ED Triage Notes (Signed)
Chest pain x 2 days. Pt became diaphoretic today and decided to call EMS. Also C/O abdominal pain x 4 days.  EMS administered 1 nitro and 324mg  aspirin without relief

## 2019-02-03 NOTE — ED Notes (Signed)
Patient back from CT.

## 2019-02-03 NOTE — ED Provider Notes (Addendum)
[facility] EMERGENCY DEPARTMENT Provider Note   CSN: [id number] Arrival date & time: 02/03/19  1156    History   Chief Complaint Chief Complaint  Patient presents with   Chest Pain    HPI [name] is a 73 y.o. female with history of AAA, aortic stenosis, CAD, heart block with pacer, hyperlipidemia presents today for chest pain.  Patient reports chest pain beginning at 4 AM on 02/02/2019 she describes a left-sided aching throb constant moderate intensity without aggravating factors has been present for nearly 32 hours upon initial evaluation.  Patient called EMS today after feeling somewhat diaphoretic, she was given 1 nitro and 324 mg aspirin with minimal relief.  Additionally patient describes a 4-day history of generalized abdominal pain moderate constant worsened with palpation without alleviating factors, denies nausea/vomiting or diarrhea.  She denies fever/chills, NVD, dysuria/hematuria, extremity pain/swelling, hemoptysis or any additional concerns today.    HPI  Past Medical History:  Diagnosis Date   AAA (abdominal aortic aneurysm) ([place])    Anxiety    Aortic stenosis    mild by 01/13/16 echo at [facility]   Arthritis    OA- hands, back, knees    CAD (coronary artery disease)    a. LHC 8/09 - mLAD myocardial bridge, pLCx 80, mRCA 18  //  b. Myoview 5/11 - No ischemia or scar, breast attenuation, EF 67%, low risk  //  c. 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 Ech 10/16: EF 60-65%, normal wall motion, grade 1 diastolic dysfunction, aortic valve cusp separation reduced, no stenosis,  mean gradient 9 mmHg, MAC, mild MR, mild LAE   Hx of syncope 05/24/2015   Hyperlipidemia    Migraines    PONV (postoperative nausea and vomiting)    Presence of permanent cardiac pacemaker    MDT PPM implant 05/24/15 Dr. 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 right Coronary artery. medical therapy along with antiplatlet therapy    EP IMPLANTABLE DEVICE N/A 05/23/2015   Procedure: Pacemaker Implant;  Surgeon: Will Meredith Leeds, MD;  Location: [facility];  Service: Cardiovascular;  Laterality: N/A;   EP IMPLANTABLE DEVICE N/A 07/10/2015   Procedure: Lead Revision Pacemaker;  Surgeon: Will Meredith Leeds, MD;  Location: [facility];  Service: Cardiovascular;  Laterality: N/A;   INSERT / REPLACE / REMOVE PACEMAKER  10/2015   last one, done at [facility]  11/03/2009   protocol: [place], no evidence of ischemia/infarct, post EF67%   PARTIAL KNEE ARTHROPLASTY Left 08/02/2017   Procedure: UNICOMPARTMENTAL KNEE;  Surgeon: Renette Butters, MD;  Location: [facility];  Service: Orthopedics;  Laterality: Left;   REPLACEMENT UNICONDYLAR JOINT KNEE Left 08/02/2017   TONSILLECTOMY     TRANSTHORACIC ECHOCARDIOGRAM  11/12/2010   EF=>55%, moderate calcification of aortic valve leaflets, no MVP seen, normal LV thickness      OB History   No obstetric history on file.      Home Medications    Prior to Admission medications   Medication Sig Start Date End Date Taking? Authorizing Provider  acetaminophen (TYLENOL) 500 MG tablet Take 500 mg by mouth daily with breakfast.    Yes [provider]  Cholecalciferol (VITAMIN D-3) 25 MCG (1000 UT) CAPS Take 1,000 Units by mouth daily.   Yes [provider]  ezetimibe (ZETIA) 10 MG tablet Take 10 mg by mouth daily.   Yes [provider]  famotidine (PEPCID) 40 MG tablet Take 40 mg by mouth daily.   Yes [provider]  gabapentin (NEURONTIN) 300 MG capsule Take 300 mg by mouth every evening.  08/04/18  Yes [provider]  Polyethyl Glycol-Propyl Glycol (SYSTANE OP) Place 1 drop into both eyes 3 (three) times daily as needed (for dry eyes).    Yes [provider]    Family History Family History  Problem Relation Age of Onset   Cancer Mother    Rheum arthritis Mother    Cancer Father        leukemia   Heart disease Father    Gout Father    Hypertension Sister    Diabetes Brother    Hyperlipidemia Brother    Gout Brother    Hypertension Brother    Heart disease Maternal Grandmother    Diabetes Maternal Grandmother    Diabetes Maternal Grandfather    Heart disease Maternal Grandfather    Tuberculosis Paternal Grandmother    Stroke Paternal Grandfather    Diabetes Brother    Hyperlipidemia Brother    Heart  disease Brother    Hyperlipidemia Son    Diabetes Maternal Aunt     Social History Social History   Tobacco Use   Smoking status: Former Smoker    Packs/day: 1.00    Years: 30.00    Pack years: 30.00    Types: Cigarettes    Quit date: 08/05/2010    Years since quitting: 8.5   Smokeless tobacco: Never Used   Tobacco comment: QUIT SMOKING IN 2013  Substance Use Topics   Alcohol use: Yes    Comment: 1 glass ( wine) - not even weekly   Drug use: No     Allergies   Sulfa antibiotics   Review of Systems Review of Systems Ten systems are reviewed and are negative for acute change except as noted in  the HPI  Physical Exam Updated Vital Signs BP 119/67 (BP Location: Right Arm)    Pulse 71    Temp 97.9 F (36.6 C) (Oral)    Resp 11    Ht _0  (1.651 m)    Wt 85.3 kg    SpO2 98%    BMI 31.28 kg/m   Physical Exam Constitutional:      General: She is not in acute distress.    Appearance: Normal appearance. She is well-developed. She is not ill-appearing or diaphoretic.  HENT:     Head: Normocephalic and atraumatic.     Right Ear: External ear normal.     Left Ear: External ear normal.     Nose: Nose normal.  Eyes:     General: Vision grossly intact. Gaze aligned appropriately.     Pupils: Pupils are equal, round, and reactive to light.  Neck:     Musculoskeletal: Normal range of motion.     Trachea: Trachea and phonation normal. No tracheal deviation.  Cardiovascular:     Rate and Rhythm: Normal rate and regular rhythm.     Pulses:          Radial pulses are 2+ on the right side and 2+ on the left side.       Dorsalis pedis pulses are 2+ on the right side and 2+ on the left side.     Heart sounds: Normal heart sounds.  Pulmonary:     Effort: Pulmonary effort is normal. No accessory muscle usage or respiratory distress.     Breath sounds: Normal breath sounds.  Chest:     Chest wall: Tenderness present. No deformity or crepitus.  Abdominal:     General: There  is no distension.     Palpations: Abdomen is soft.     Tenderness: There is no abdominal tenderness. There is no guarding or rebound.  Musculoskeletal: Normal range of motion.     Right lower leg: She exhibits no tenderness. No edema.     Left lower leg: She exhibits no tenderness. No edema.  Skin:    General: Skin is warm and dry.  Neurological:     Mental Status: She is alert.     GCS: GCS eye subscore is 4. GCS verbal subscore is 5. GCS motor subscore is 6.     Comments: Speech is clear and goal oriented, follows commands Major Cranial nerves without deficit, no facial droop Moves extremities without ataxia, coordination intact  Psychiatric:        Behavior: Behavior normal.      ED Treatments / Results  Labs (all labs ordered are listed, but only abnormal results are displayed) Labs Reviewed  BASIC METABOLIC PANEL - Abnormal; Notable for the following components:      Result Value   Glucose, Bld 104 (*)    All other components within normal limits  URINALYSIS, ROUTINE W REFLEX MICROSCOPIC - Abnormal; Notable for the following components:   Color, Urine STRAW (*)    Leukocytes,Ua TRACE (*)    All other components within normal limits  SARS CORONAVIRUS 2 (HOSPITAL ORDER, [facility])  CBC WITH DIFFERENTIAL/PLATELET  HEPATIC FUNCTION PANEL  LIPASE, BLOOD  LIPID PANEL  HEMOGLOBIN A1C  TSH  TROPONIN I (HIGH SENSITIVITY)  TROPONIN I (HIGH SENSITIVITY)    EKG EKG Interpretation  Date/Time:  Saturday February 03 2019 12:06:39 EDT Ventricular Rate:  63 PR Interval:    QRS Duration: 157 QT Interval:  430 QTC  Calculation: 441 R Axis:   -84 Text Interpretation:  Sinus rhythm paced . no STEMI Confirmed by Charlesetta Shanks [phone]) on 02/03/2019 4:41:51 PM   Radiology Dg Chest 1 View  Result Date: 02/03/2019 CLINICAL DATA:  Central and left chest pain for 2 days. EXAM: CHEST  1 VIEW COMPARISON:  PA and lateral chest 07/11/2015. FINDINGS: Lungs are  clear. Heart size is normal. No pneumothorax or pleural fluid. Pacing device is in place. No acute bony abnormality. IMPRESSION: No acute disease. Electronically Signed   By: Inge Rise M.D.   On: 02/03/2019 12:47   Ct Angio Chest/abd/pel For Dissection W And/or Wo Contrast  Result Date: 02/03/2019 CLINICAL DATA:  Diaphoretic. Abdominal pain 4 days. Concern for aortic dissection. EXAM: CT ANGIOGRAPHY CHEST, ABDOMEN AND PELVIS TECHNIQUE: Multidetector CT imaging through the chest, abdomen and pelvis was performed using the standard protocol during bolus administration of intravenous contrast. Multiplanar reconstructed images and MIPs were obtained and reviewed to evaluate the vascular anatomy. CONTRAST:  167m OMNIPAQUE IOHEXOL 350 MG/ML SOLN COMPARISON:  None. FINDINGS: CTA CHEST FINDINGS Cardiovascular: Noncontrast CT images of the chest demonstrate no intramural hematoma within the thoracic aorta. contrast enhanced series demonstrates no aortic dissection, ulceration or aneurysm. The ascending aorta is normal diameter at 33 mm. Typical 3 great vessel anatomy. Scattered intimal calcifications in the thoracic aorta. Pacemaker in place with cardiac leads in the RIGHT heart. Scattered coronary calcifications. Mediastinum/Nodes: No axillary or supraclavicular adenopathy. No mediastinal hilar adenopathy. Esophagus normal. Trachea appears normal. Lungs/Pleura: Lungs are clear.  Airways normal. Musculoskeletal: No acute osseous abnormality. Review of the MIP images confirms the above findings. CTA ABDOMEN AND PELVIS FINDINGS VASCULAR Aorta: No dissection or aneurysm of the abdominal aorta. Celiac: Widely patent SMA: Widely patent. Replaced RIGHT hepatic artery originates from the SMA. Renals: Single bilateral patent renal arteries - patent IMA: Patent Inflow: Normal Veins: Normal Review of the MIP images confirms the above findings. NON-VASCULAR Hepatobiliary: No focal hepatic lesion. No biliary duct dilatation.  Gallbladder is normal. Common bile duct is normal. Pancreas: Pancreas is normal. No ductal dilatation. No pancreatic inflammation. Spleen: Normal spleen Adrenals/urinary tract: Adrenal glands are normal. Kidneys enhance symmetrically. Within the cortex of the LEFT mid kidney is partially exophytic enhancing solid lesion measuring 2.4 x 2.0 by 2.2 cm (image 129/6) Stomach/Bowel: Stomach, small bowel, appendix, and cecum are normal. The colon and rectosigmoid colon are normal. Vascular/Lymphatic: No abdominal lymphadenopathy. Reproductive: Post hysterectomy Other: No free fluid. Musculoskeletal: Posterior lumbar fusion. Degenerative sclerosis in the upper lumbar spine. 5 cm sclerotic lesion of the RIGHT iliac bone is presumably postsurgical. Review of the MIP images confirms the above findings. IMPRESSION: Chest Impression: 1. No evidence of aortic dissection or aneurysm. 2. Coronary artery calcification and Aortic Atherosclerosis (ICD10-I70.0). Abdomen / Pelvis Impression: 1. No abdominal aortic aneurysm or dissection. 2. 5 cm sclerotic lesion in the RIGHT iliac bone presumably related to prior bone harvesting. 3. Enhancing lesion in the mid LEFT kidney is consistent with a solid renal neoplasm (benign or malignant). Recommend non emergent urology consultation. Findings conveyed toBRANDON Nashawn Hillock on 02/03/2019  at14:21. Electronically Signed   By: SSuzy BouchardM.D.   On: 02/03/2019 14:27    Procedures Procedures (including critical care time)  Medications Ordered in ED Medications  ezetimibe (ZETIA) tablet 10 mg (has no administration in time range)  famotidine (PEPCID) tablet 40 mg (has no administration in time range)  gabapentin (NEURONTIN) capsule 300 mg (has no administration in time range)  enoxaparin (LOVENOX)  injection 40 mg (has no administration in time range)  acetaminophen (TYLENOL) tablet 650 mg (has no administration in time range)  ondansetron (ZOFRAN) injection 4 mg (has no  administration in time range)  sodium chloride 0.9 % bolus 500 mL (500 mLs Intravenous New Bag/Given 02/03/19 1333)  iohexol (OMNIPAQUE) 350 MG/ML injection 100 mL (100 mLs Intravenous Contrast Given 02/03/19 1358)     Initial Impression / Assessment and Plan / ED Course  I have reviewed the triage vital signs and the nursing notes.  Pertinent labs & imaging results that were available during my care of the patient were reviewed by me and considered in my medical decision making (see chart for details).  Clinical Course as of Feb 02 1749  Sat Feb 03, 2019  1531 Dr. Lorin Mercy   [BM]  [phone] Consult to cardiology, Dr. Bronson Ing advises not ST elevation.   [BM]    Clinical Course User Index [BM] Deliah Boston, PA-C   Initial troponin 3 Lipase within normal limits LFTs within normal limits Urinalysis nonacute CBC within normal limits BMP nonacute DG chest:  IMPRESSION: No acute disease. EKG: Sinus rhythm paced . no STEMI Confirmed by Charlesetta Shanks [phone]) on 02/03/2019 4:41:51 PM  CTA dissection study: IMPRESSION: Chest Impression: 1. No evidence of aortic dissection or aneurysm. 2. Coronary artery calcification and Aortic Atherosclerosis (ICD10-I70.0). Abdomen / Pelvis Impression: 1. No abdominal aortic aneurysm or dissection. 2. 5 cm sclerotic lesion in the RIGHT iliac bone presumably related to prior bone harvesting. 3. Enhancing lesion in the mid LEFT kidney is consistent with a solid renal neoplasm (benign or malignant). Recommend non emergent urology consultation. - CT scan was obtained because patient's history of AAA with chest and abdominal pain, no evidence of aneurysm CT scan.  Reassuring initial work-up and stable vital signs.  Patient reassessed resting comfortably no acute distress she states improvement of chest pain with rest here in the emergency department.  I have advised her of incidental CT scan finding including enhancing lesion of the left kidney and need for future  urology follow-up.  I discussed plan for admission with the patient and her husband who is at bedside and they are both agreeable at this time.  Discussed case with Dr. Lorin Mercy from hospitalist service who is asked for cardiology involvement. - Discussed case and EKG with Dr. Bronson Ing from cardiology service advises EKG consistent with paced rhythm no evidence of ST elevation agrees with hospitalist admission. - Patient has been admitted to hospital service for further evaluation and management.    Note: Portions of this report may have been transcribed using voice recognition software. Every effort was made to ensure accuracy; however, inadvertent computerized transcription errors may still be present.  Final Clinical Impressions(s) / ED Diagnoses   Final diagnoses:  Chest pain, unspecified type    ED Discharge Orders    None       Deliah Boston, PA-C 02/03/19 1704    Deliah Boston, PA-C 02/03/19 1750    Charlesetta Shanks, MD 02/03/19 [phone]

## 2019-02-03 NOTE — ED Notes (Signed)
Patient transported to CT 

## 2019-02-03 NOTE — Consult Note (Addendum)
Cardiology Consultation:   Patient ID: [name] MRN: [id number]; DOB: [birth year]  Admit date: 02/03/2019 Date of Consult: 02/03/2019  Primary Care Provider: Chesley Noon, MD Primary Cardiologist: No primary care provider on file.  Primary Electrophysiologist:  None    Patient Profile:   [name] is a 73 y.o. female with a hx of nonobstructive CAD, atrial arrhythmia, dyslipidemia, symptomatic bradycardia s/p PPM who is being seen today for the evaluation of chest pain at the request of Dr. Lorin Rhodes.  History of Present Illness:   [name] is a 73 year old female with past medical history noted below.  She was admitted in October 2016 with unexplained syncope.  Echo demonstrated normal LV function at that time.  He was discharged with an outpatient monitor.  Event monitor noted episodes of complete heart block with which she was symptomatic.  She was therefore admitted to the hospital and underwent implantation of dual-chamber pacemaker in November 2016.  Post implant she did complain of chest pain that improved with changing her basal rate from 50 bpm to 60 bpm.  Continued to have pain and a pocket revision was arranged for January 2017.  She did well postoperatively.  She was last seen back in March 2017 by [name].  Just prior to that had seen [name] in the office and complained of dyspnea, dizziness and chest discomfort.  D-dimer obtained is negative, and underwent a Lexiscan Myoview which was negative for ischemia.  At her follow-up appointment with [name] results were discussed and she was encouraged to stay well-hydrated.  She continued to have ongoing symptoms and referred to [name].  He met with her and discussed device extraction to see if this would help with symptoms.  She was to consider this and call back with a decision.  Since that time she transferred her care to [name].  She had her pacemaker extracted, and replaced.  Since then has been followed  through [name].  Has not had any further invasive work-up.  Currently lives at home alone.  Does her ADLs independently.  Does not normally have any anginal symptoms with day-to-day activities.  States Thursday morning she was awoken with dull throbbing left-sided chest pain underneath her left breast.  Symptoms waxed and waned throughout Thursday.  States she just did not feel well on Friday but could not pinpoint symptoms.  Got concerned about her symptoms and eventually sought medical care.  In the ED her labs showed stable electrolytes,HsT 3>> 5, hemoglobin 14.3.  EKG showed V paced rhythm.  Chest x-ray was negative.  Underwent CT chest which was negative for dissection, but did report lesion on the mid left kidney consistent with a solid renal neoplasm.  Recommendation for nonemergent urology consultation.   In talking with the patient she does report family history of CAD with siblings having MIs.  She also has dyslipidemia, not currently on statin only Zetia.  States she had previously been on statin but recalls not tolerating.  Unsure if she had myalgias.  She is also a former smoker, states she quit about 5 to 7 years ago.  In talking with her states she is not a heavy smoker but did smoke daily. Of note has been seeing a chiropractor who is been working on her right hip.  Has been getting routine adjustments.  Heart Pathway Score:     Past Medical History:  Diagnosis Date   AAA (abdominal aortic aneurysm) (HCC)    Anxiety  Aortic stenosis    mild by 01/13/16 echo at [facility]   Arthritis    OA- hands, back, knees    CAD (coronary artery disease)    a. LHC 8/09 - mLAD myocardial bridge, pLCx 64, mRCA 32  //  b. Myoview 5/11 - No ischemia or scar, breast attenuation, EF 67%, low risk  //  c. 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 Carotid US 7/15 - +plaque, no significant ICA stenosis   Chronic pain    low back    Complete heart block ([place]) 05/23/2015   s/p Pacemaker 11/16   GERD (gastroesophageal reflux disease)    Headache    h/o of migraines long time ago   Heart murmur    History of echocardiogram    a. 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 right Coronary artery. medical therapy along with antiplatlet therapy    EP IMPLANTABLE DEVICE N/A 05/23/2015   Procedure: Pacemaker Implant;  Surgeon: Will Meredith Leeds, MD;  Location: [facility];  Service: Cardiovascular;  Laterality: N/A;   EP IMPLANTABLE DEVICE N/A 07/10/2015   Procedure: Lead Revision Pacemaker;  Surgeon: Will Meredith Leeds, MD;  Location: [facility];  Service: Cardiovascular;  Laterality: N/A;   INSERT / REPLACE / REMOVE PACEMAKER  10/2015   last one, done at [facility] [place] [facility]  11/03/2009   protocol: [place], no evidence of ischemia/infarct, post EF67%   PARTIAL KNEE ARTHROPLASTY Left 08/02/2017   Procedure: UNICOMPARTMENTAL KNEE;  Surgeon: Renette Butters, MD;  Location: [facility];  Service: Orthopedics;  Laterality: Left;   REPLACEMENT UNICONDYLAR JOINT KNEE Left 08/02/2017   TONSILLECTOMY     TRANSTHORACIC ECHOCARDIOGRAM  11/12/2010   EF=>55%,  moderate calcification of aortic valve leaflets, no MVP seen, normal LV thickness      Home Medications:  Prior to Admission medications   Medication Sig Start Date End Date Taking? Authorizing Provider  acetaminophen (TYLENOL) 500 MG tablet Take 500 mg by mouth daily with breakfast.    Yes [provider]  Cholecalciferol (VITAMIN D-3) 25 MCG (1000 UT) CAPS Take 1,000 Units by mouth daily.   Yes [provider]  ezetimibe (ZETIA) 10 MG tablet Take 10 mg by mouth daily.   Yes [provider]  famotidine (PEPCID) 40 MG tablet Take 40 mg by mouth daily.   Yes [provider]  gabapentin (NEURONTIN) 300 MG capsule Take 300 mg by mouth every evening.  08/04/18  Yes [provider]  Polyethyl Glycol-Propyl Glycol (SYSTANE OP) Place 1 drop into  both eyes 3 (three) times daily as needed (for dry eyes).    Yes [provider]    Inpatient Medications: Scheduled Meds:  Continuous Infusions:  PRN Meds:   Allergies:    Allergies  Allergen Reactions   Sulfa Antibiotics Rash    Social History:   Social History   Socioeconomic History   Marital status: Widowed    Spouse name: Not on file   Number of children: 1   Years of education: Not on file   Highest education level: Bachelor's degree (e.g., BA, AB, BS)  Occupational History    Comment: retired Pharmacist, hospital x 32 years   Scientist, product/process development strain: Not on file   Food insecurity    Worry: Not on file    Inability: Not on Lexicographer needs    Medical: Not on file    Non-medical: Not on file  Tobacco Use   Smoking status: Former Smoker    Packs/day: 1.00    Years: 30.00    Pack years: 30.00    Types: Cigarettes    Quit date: 08/05/2010    Years since quitting: 8.5   Smokeless tobacco: Never Used   Tobacco comment: QUIT SMOKING IN 2013  Substance and Sexual Activity   Alcohol use: Yes    Comment: 1 glass ( wine) - not even weekly   Drug  use: No   Sexual activity: Not on file  Lifestyle   Physical activity    Days per week: Not on file    Minutes per session: Not on file   Stress: Not on file  Relationships   Social connections    Talks on phone: Not on file    Gets together: Not on file    Attends religious service: Not on file    Active member of club or organization: Not on file    Attends meetings of clubs or organizations: Not on file    Relationship status: Not on file   Intimate partner violence    Fear of current or ex partner: Not on file    Emotionally abused: Not on file    Physically abused: Not on file    Forced sexual activity: Not on file  Other Topics Concern   Not on file  Social History Narrative   Lives alone   Caffeine- coffee, 2 cups daily    Family History:    Family History  Problem Relation Age of Onset   Cancer Mother    Rheum arthritis Mother    Cancer Father        leukemia   Heart disease Father    Gout Father    Hypertension Sister    Diabetes Brother    Hyperlipidemia Brother    Gout Brother    Hypertension Brother    Heart disease Maternal Grandmother    Diabetes Maternal Grandmother    Diabetes Maternal Grandfather    Heart disease Maternal Grandfather    Tuberculosis Paternal Grandmother    Stroke Paternal Grandfather    Diabetes Brother    Hyperlipidemia Brother    Heart disease Brother    Hyperlipidemia Son    Diabetes Maternal Aunt      ROS:  Please see the history of present illness.   All other ROS reviewed and negative.     Physical Exam/Data:   Vitals:   02/03/19 1205 02/03/19 1211  BP: 119/67   Pulse: 71   Resp: 11   Temp: 97.9  F (36.6 C)   TempSrc: Oral   SpO2: 98%   Weight:  85.3 kg  Height:  _0  (1.651 m)   No intake or output data in the 24 hours ending 02/03/19 1550 Last 3 Weights 02/03/2019 07/21/2017 09/22/2015  Weight (lbs) 188 lb 188 lb 3.2 oz 183 lb 6.4 oz  Weight (kg) 85.276 kg 85.367 kg 83.19 kg       Body mass index is 31.28 kg/m.  General:  Well nourished, well developed, in no acute distress HEENT: normal Lymph: no adenopathy Neck: no JVD Vascular: No carotid bruits Cardiac:  normal S1, S2; RRR; + systolic murmur  Lungs:  clear to auscultation bilaterally, no wheezing, rhonchi or rales  Abd: soft, nontender, no hepatomegaly  Ext: no edema Musculoskeletal:  No deformities, BUE and BLE strength normal and equal Skin: warm and dry  Neuro:  CNs 2-12 intact, no focal abnormalities noted Psych:  Normal affect   EKG:  The EKG was personally reviewed and demonstrates:  V paced rhythm  Relevant CV Studies:  TTE: 10/16  Study Conclusions  - Left ventricle: The cavity size was normal. Systolic function was   normal. The estimated ejection fraction was in the range of 60%   to 65%. Wall motion was normal; there were no regional wall   motion abnormalities. Doppler parameters are consistent with   abnormal left ventricular relaxation (grade 1 diastolic   dysfunction). - Aortic valve: Cusp separation was reduced. Transvalvular velocity   was minimally increased. There was no stenosis. Peak velocity   (S): 224 cm/s. Mean gradient (S): 9 mm Hg. - Mitral valve: Calcified annulus. There was mild regurgitation. - Left atrium: The atrium was mildly dilated.  Laboratory Data:  High Sensitivity Troponin:   Recent Labs  Lab 02/03/19 1240  TROPONINIHS 3     Cardiac EnzymesNo results for input(s): TROPONINI in the last 168 hours. No results for input(s): TROPIPOC in the last 168 hours.  Chemistry Recent Labs  Lab 02/03/19 1240  NA 137  K 3.9  CL 104  CO2 23  GLUCOSE 104*  BUN 17  CREATININE 0.67  CALCIUM 9.5  GFRNONAA >60  GFRAA >60  ANIONGAP 10    Recent Labs  Lab 02/03/19 1240  PROT 6.6  ALBUMIN 4.0  AST 22  ALT 25  ALKPHOS 50  BILITOT 0.5   Hematology Recent Labs  Lab 02/03/19 1240  WBC 9.5  RBC 4.71  HGB 14.3  HCT 42.5  MCV 90.2  MCH 30.4  MCHC  33.6  RDW 14.5  PLT 205   BNPNo results for input(s): BNP, PROBNP in the last 168 hours.  DDimer No results for input(s): DDIMER in the last 168 hours.   Radiology/Studies:  Dg Chest 1 View  Result Date: 02/03/2019 CLINICAL DATA:  Central and left chest pain for 2 days. EXAM: CHEST  1 VIEW COMPARISON:  PA and lateral chest 07/11/2015. FINDINGS: Lungs are clear. Heart size is normal. No pneumothorax or pleural fluid. Pacing device is in place. No acute bony abnormality. IMPRESSION: No acute disease. Electronically Signed   By: Inge Rise M.D.   On: 02/03/2019 12:47   Ct Angio Chest/abd/pel For Dissection W And/or Wo Contrast  Result Date: 02/03/2019 CLINICAL DATA:  Diaphoretic. Abdominal pain 4 days. Concern for aortic dissection. EXAM: CT ANGIOGRAPHY CHEST, ABDOMEN AND PELVIS TECHNIQUE: Multidetector CT imaging through the chest, abdomen and pelvis was performed using the standard protocol during bolus administration of intravenous contrast. Multiplanar reconstructed images  and MIPs were obtained and reviewed to evaluate the vascular anatomy. CONTRAST:  14m OMNIPAQUE IOHEXOL 350 MG/ML SOLN COMPARISON:  None. FINDINGS: CTA CHEST FINDINGS Cardiovascular: Noncontrast CT images of the chest demonstrate no intramural hematoma within the thoracic aorta. contrast enhanced series demonstrates no aortic dissection, ulceration or aneurysm. The ascending aorta is normal diameter at 33 mm. Typical 3 great vessel anatomy. Scattered intimal calcifications in the thoracic aorta. Pacemaker in place with cardiac leads in the RIGHT heart. Scattered coronary calcifications. Mediastinum/Nodes: No axillary or supraclavicular adenopathy. No mediastinal hilar adenopathy. Esophagus normal. Trachea appears normal. Lungs/Pleura: Lungs are clear.  Airways normal. Musculoskeletal: No acute osseous abnormality. Review of the MIP images confirms the above findings. CTA ABDOMEN AND PELVIS FINDINGS VASCULAR Aorta: No  dissection or aneurysm of the abdominal aorta. Celiac: Widely patent SMA: Widely patent. Replaced RIGHT hepatic artery originates from the SMA. Renals: Single bilateral patent renal arteries - patent IMA: Patent Inflow: Normal Veins: Normal Review of the MIP images confirms the above findings. NON-VASCULAR Hepatobiliary: No focal hepatic lesion. No biliary duct dilatation. Gallbladder is normal. Common bile duct is normal. Pancreas: Pancreas is normal. No ductal dilatation. No pancreatic inflammation. Spleen: Normal spleen Adrenals/urinary tract: Adrenal glands are normal. Kidneys enhance symmetrically. Within the cortex of the LEFT mid kidney is partially exophytic enhancing solid lesion measuring 2.4 x 2.0 by 2.2 cm (image 129/6) Stomach/Bowel: Stomach, small bowel, appendix, and cecum are normal. The colon and rectosigmoid colon are normal. Vascular/Lymphatic: No abdominal lymphadenopathy. Reproductive: Post hysterectomy Other: No free fluid. Musculoskeletal: Posterior lumbar fusion. Degenerative sclerosis in the upper lumbar spine. 5 cm sclerotic lesion of the RIGHT iliac bone is presumably postsurgical. Review of the MIP images confirms the above findings. IMPRESSION: Chest Impression: 1. No evidence of aortic dissection or aneurysm. 2. Coronary artery calcification and Aortic Atherosclerosis (ICD10-I70.0). Abdomen / Pelvis Impression: 1. No abdominal aortic aneurysm or dissection. 2. 5 cm sclerotic lesion in the RIGHT iliac bone presumably related to prior bone harvesting. 3. Enhancing lesion in the mid LEFT kidney is consistent with a solid renal neoplasm (benign or malignant). Recommend non emergent urology consultation. Findings conveyed toBRANDON Rhodes on 02/03/2019  at14:21. Electronically Signed   By: SSuzy BouchardM.D.   On: 02/03/2019 14:27    Assessment and Plan:   GSHELDON AMARAis a 73y.o. female with a hx of nonobstructive CAD, atrial arrhythmia, dyslipidemia, symptomatic bradycardia s/p  PPM who is being seen today for the evaluation of chest pain at the request of Dr. YLorin Rhodes  1. Chest pain: seems atypical in nature, able point to location near left breast and tender with touch. Given duration of symptoms would expect to see elevation in HsT. EKG is paced. Seems MSK in nature. Has been seeing a chiropractor regularly for adjustments. Does have RFs with prior tobacco use, Dyslipidemia (only on Zetia), and family hx of CAD. Will review with MD, consider stress testing, though could be as an outpatient through her PCP cardiologist.   2. CHB s/p PPM: had ppm placed by Dr. TLovena Le with revision. Didn't tolerate. [facility]. Does remote transmissions.   3. Dyslipidemia: reports only being on Zetia Rx by her cardiologist. Does not prefer to be on a statin, unless necessary. -- check lipids  For questions or updates, please contact CEwingPlease consult [email or web address] for contact info under     Signed, LReino Bellis NP  02/03/2019 3:50 PM  The patient was seen and examined, and I agree with the history, physical exam, assessment and plan as documented above, with modifications as noted below. I have also personally reviewed all relevant documentation, old records, labs, and both radiographic and cardiovascular studies. I have also independently interpreted old and new ECG's.  Briefly, this is a 73 year old woman with a history of nonobstructive CAD and complete heart block status post pacemaker.  She is been having chiropractic work on her neck and her back.  About 2 mornings ago she began to experience some left-sided inframammary chest wall tenderness.  She describes it as a "dull ache ".  She has no exertional chest pain or dyspnea.  Energy levels have remained stable over the last several months, although she admits to snacking more and not being able to exercise as the gym is  closed.  She is widowed and lives alone.  She has a son who lives in [place].  She has a boyfriend who is 59 whom she attends church with an Archivist.  Initial high-sensitivity troponin is normal.  ECG demonstrates a paced rhythm.  Overall, symptoms are atypical for an ischemic etiology.  Chest pain appears to be musculoskeletal given the significant tenderness with palpation.  An outpatient stress test could always be considered with her primary cardiologist if they feel it is necessary but she does not warrant inpatient one.  No further recommendations at this time.  Kate Sable, MD, [facility]  02/03/2019 5:30 PM

## 2019-02-03 NOTE — Progress Notes (Signed)
Received to [id number] via stretcher from ED. Dr. Bronson Ing into room.

## 2019-02-04 DIAGNOSIS — E785 Hyperlipidemia, unspecified: Secondary | ICD-10-CM | POA: Diagnosis not present

## 2019-02-04 DIAGNOSIS — R0789 Other chest pain: Secondary | ICD-10-CM | POA: Diagnosis not present

## 2019-02-04 DIAGNOSIS — N289 Disorder of kidney and ureter, unspecified: Secondary | ICD-10-CM | POA: Diagnosis not present

## 2019-02-04 DIAGNOSIS — R079 Chest pain, unspecified: Secondary | ICD-10-CM | POA: Diagnosis not present

## 2019-02-04 DIAGNOSIS — I2510 Atherosclerotic heart disease of native coronary artery without angina pectoris: Secondary | ICD-10-CM | POA: Diagnosis not present

## 2019-02-04 MED ORDER — DICLOFENAC SODIUM 1 % TD GEL: 2.0000 g | Freq: Four times a day (QID) | TRANSDERMAL | 0 refills | Status: DC

## 2019-02-04 MED ORDER — DICLOFENAC SODIUM 1 % TD GEL
2.0000 g | Freq: Four times a day (QID) | TRANSDERMAL | Status: DC
  Administered 2019-02-04: 2 g via TOPICAL
  Filled 2019-02-04: qty 100

## 2019-02-04 NOTE — Progress Notes (Signed)
No further complaints of chest pain. Has some mild residual chest wall tenderness. She decided against having a stress test. She will be discharged today by internal medicine.

## 2019-02-04 NOTE — Discharge Summary (Signed)
Physician Discharge Summary  [name] HYW:[id number] DOB: 1946/05/02 DOA: 02/03/2019  PCP: Chesley Noon, MD  Admit date: 02/03/2019 Discharge date: 02/04/2019  Admitted From: home Discharge disposition: home   Recommendations for Outpatient Follow-Up:   1. voltaren gel for chest wall pain 2. 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   Dyslipidemia   Left renal mass    Discharge Condition: Improved.  Diet recommendation: Low sodium, heart healthy  Wound care: None.  Code status: Full.   History of Present Illness:   [name] is a 73 y.o. female with medical history significant of pacemaker placement; HLD; remote uterine cancer; and CAD presenting with chest pain.  She reports having pain in her chest for the last few days.  It is a constant ache.  Thursday AM, it woke her about 2AM.  She took Tylenol and went back to sleep and it woke her up again about 4AM.  She hasn't had much sleep since.  She has had diarrhea and abdominal pain.  She decided today to go in to be seen.  The pain is medial of her left lower breast, just to the right of the lower part of her pacemaker incision.  Her legs are weak and her arms and elbows are weak.  No n/v.  No SOB.  No COVID contacts and she is careful about it.  Her son lives in [place] and was here visiting last week.     Hospital Course by Problem:   Chest pain -Patient with left chest pain that has been present continuously for several days, with reproducible TTP -She does have a h/o repeated pacemaker placements and removals and appears to have some chronic chest wall discomfort resulting -Overall, low suspicion for ACS -CXR unremarkable.  -HS troponin negative x 2. -EKG not indicative of acute ischemia; cardiology has reviewed. -Patient has been seen  by cardiology and they do not recommend further ischemic evaluation at this time. -voltaren gel  HLD -Continue Zetia  Left Renal Mass -Incidental finding on CTA today -Needs outpatient evaluation     Medical Consultants:    cards  Discharge Exam:   Vitals:   02/03/19 2336 02/04/19 0621  BP: (!) 121/54 (!) 120/56  Pulse: 81 74  Resp: 18 14  Temp: 97.9 F (36.6 C) 97.7 F (36.5 C)  SpO2: 93% 94%   Vitals:   02/03/19 1835 02/03/19 2030 02/03/19 2336 02/04/19 0621  BP: 136/63 137/69 (!) 121/54 (!) 120/56  Pulse: 71 82 81 74  Resp: 14 18 18 14   Temp: 98.1 F (36.7 C) 97.8 F (36.6 C) 97.9 F (36.6 C) 97.7 F (36.5 C)  TempSrc: Oral Oral Oral Oral  SpO2: 99% 99% 93% 94%  Weight:    85.3 kg  Height:        General exam: Appears calm and comfortable.  The results of significant diagnostics from this hospitalization (including imaging, microbiology, ancillary and laboratory) are listed below for reference.     Procedures and Diagnostic Studies:   Dg Chest 1 View  Result Date: 02/03/2019 CLINICAL DATA:  Central and left chest pain for 2 days. EXAM: CHEST  1 VIEW COMPARISON:  PA and lateral chest 07/11/2015. FINDINGS: Lungs are clear. Heart size is normal. No pneumothorax or pleural fluid. Pacing device is in place. No  acute bony abnormality. IMPRESSION: No acute disease. Electronically Signed   By: Inge Rise M.D.   On: 02/03/2019 12:47   Ct Angio Chest/abd/pel For Dissection W And/or Wo Contrast  Result Date: 02/03/2019 CLINICAL DATA:  Diaphoretic. Abdominal pain 4 days. Concern for aortic dissection. EXAM: CT ANGIOGRAPHY CHEST, ABDOMEN AND PELVIS TECHNIQUE: Multidetector CT imaging through the chest, abdomen and pelvis was performed using the standard protocol during bolus administration of intravenous contrast. Multiplanar reconstructed images and MIPs were obtained and reviewed to evaluate the vascular anatomy. CONTRAST:  113mL OMNIPAQUE IOHEXOL 350 MG/ML  SOLN COMPARISON:  None. FINDINGS: CTA CHEST FINDINGS Cardiovascular: Noncontrast CT images of the chest demonstrate no intramural hematoma within the thoracic aorta. contrast enhanced series demonstrates no aortic dissection, ulceration or aneurysm. The ascending aorta is normal diameter at 33 mm. Typical 3 great vessel anatomy. Scattered intimal calcifications in the thoracic aorta. Pacemaker in place with cardiac leads in the RIGHT heart. Scattered coronary calcifications. Mediastinum/Nodes: No axillary or supraclavicular adenopathy. No mediastinal hilar adenopathy. Esophagus normal. Trachea appears normal. Lungs/Pleura: Lungs are clear.  Airways normal. Musculoskeletal: No acute osseous abnormality. Review of the MIP images confirms the above findings. CTA ABDOMEN AND PELVIS FINDINGS VASCULAR Aorta: No dissection or aneurysm of the abdominal aorta. Celiac: Widely patent SMA: Widely patent. Replaced RIGHT hepatic artery originates from the SMA. Renals: Single bilateral patent renal arteries - patent IMA: Patent Inflow: Normal Veins: Normal Review of the MIP images confirms the above findings. NON-VASCULAR Hepatobiliary: No focal hepatic lesion. No biliary duct dilatation. Gallbladder is normal. Common bile duct is normal. Pancreas: Pancreas is normal. No ductal dilatation. No pancreatic inflammation. Spleen: Normal spleen Adrenals/urinary tract: Adrenal glands are normal. Kidneys enhance symmetrically. Within the cortex of the LEFT mid kidney is partially exophytic enhancing solid lesion measuring 2.4 x 2.0 by 2.2 cm (image 129/6) Stomach/Bowel: Stomach, small bowel, appendix, and cecum are normal. The colon and rectosigmoid colon are normal. Vascular/Lymphatic: No abdominal lymphadenopathy. Reproductive: Post hysterectomy Other: No free fluid. Musculoskeletal: Posterior lumbar fusion. Degenerative sclerosis in the upper lumbar spine. 5 cm sclerotic lesion of the RIGHT iliac bone is presumably postsurgical. Review  of the MIP images confirms the above findings. IMPRESSION: Chest Impression: 1. No evidence of aortic dissection or aneurysm. 2. Coronary artery calcification and Aortic Atherosclerosis (ICD10-I70.0). Abdomen / Pelvis Impression: 1. No abdominal aortic aneurysm or dissection. 2. 5 cm sclerotic lesion in the RIGHT iliac bone presumably related to prior bone harvesting. 3. Enhancing lesion in the mid LEFT kidney is consistent with a solid renal neoplasm (benign or malignant). Recommend non emergent urology consultation. Findings conveyed toBRANDON MORELLI on 02/03/2019  at14:21. Electronically Signed   By: Suzy Bouchard M.D.   On: 02/03/2019 14:27     Labs:   Basic Metabolic Panel: Recent Labs  Lab 02/03/19 1240  NA 137  K 3.9  CL 104  CO2 23  GLUCOSE 104*  BUN 17  CREATININE 0.67  CALCIUM 9.5   GFR Estimated Creatinine Clearance: 69.5 mL/min (by C-G formula based on SCr of 0.67 mg/dL). Liver Function Tests: Recent Labs  Lab 02/03/19 1240  AST 22  ALT 25  ALKPHOS 50  BILITOT 0.5  PROT 6.6  ALBUMIN 4.0   Recent Labs  Lab 02/03/19 1240  LIPASE 34   No results for input(s): AMMONIA in the last 168 hours. Coagulation profile No results for input(s): INR, PROTIME in the last 168 hours.  CBC: Recent Labs  Lab 02/03/19 1240  WBC 9.5  NEUTROABS 6.5  HGB 14.3  HCT 42.5  MCV 90.2  PLT 205   Cardiac Enzymes: No results for input(s): CKTOTAL, CKMB, CKMBINDEX, TROPONINI in the last 168 hours. BNP: Invalid input(s): POCBNP CBG: No results for input(s): GLUCAP in the last 168 hours. D-Dimer No results for input(s): DDIMER in the last 72 hours. Hgb A1c Recent Labs    02/03/19 1240  HGBA1C 5.5   Lipid Profile Recent Labs    02/03/19 1530  CHOL 190  HDL 53  LDLCALC 120*  TRIG 83  CHOLHDL 3.6   Thyroid function studies Recent Labs    02/03/19 1834  TSH 0.458   Anemia work up No results for input(s): VITAMINB12, FOLATE, FERRITIN, TIBC, IRON, RETICCTPCT in  the last 72 hours. Microbiology Recent Results (from the past 240 hour(s))  SARS Coronavirus 2 [facility] order, Performed in [facility] hospital lab) Nasopharyngeal Nasopharyngeal Swab     Status: None   Collection Time: 02/03/19  5:26 PM   Specimen: Nasopharyngeal Swab  Result Value Ref Range Status   SARS Coronavirus 2 NEGATIVE NEGATIVE Final    Comment: (NOTE) If result is NEGATIVE SARS-CoV-2 target nucleic acids are NOT DETECTED. The SARS-CoV-2 RNA is generally detectable in upper and lower  respiratory specimens during the acute phase of infection. The lowest  concentration of SARS-CoV-2 viral copies this assay can detect is 250  copies / mL. A negative result does not preclude SARS-CoV-2 infection  and should not be used as the sole basis for treatment or other  patient management decisions.  A negative result may occur with  improper specimen collection / handling, submission of specimen other  than nasopharyngeal swab, presence of viral mutation(s) within the  areas targeted by this assay, and inadequate number of viral copies  (<250 copies / mL). A negative result must be combined with clinical  observations, patient history, and epidemiological information. If result is POSITIVE SARS-CoV-2 target nucleic acids are DETECTED. The SARS-CoV-2 RNA is generally detectable in upper and lower  respiratory specimens dur ing the acute phase of infection.  Positive  results are indicative of active infection with SARS-CoV-2.  Clinical  correlation with patient history and other diagnostic information is  necessary to determine patient infection status.  Positive results do  not rule out bacterial infection or co-infection with other viruses. If result is PRESUMPTIVE POSTIVE SARS-CoV-2 nucleic acids MAY BE PRESENT.   A presumptive positive result was obtained on the submitted specimen  and confirmed on repeat testing.  While 2019 novel coronavirus  (SARS-CoV-2) nucleic acids may be  present in the submitted sample  additional confirmatory testing may be necessary for epidemiological  and / or clinical management purposes  to differentiate between  SARS-CoV-2 and other Sarbecovirus currently known to infect humans.  If clinically indicated additional testing with an alternate test  methodology [phone]) is advised. The SARS-CoV-2 RNA is generally  detectable in upper and lower respiratory sp ecimens during the acute  phase of infection. The expected result is Negative. Fact Sheet for Patients:  StrictlyIdeas.no Fact Sheet for Healthcare Providers: BankingDealers.co.za This test is not yet approved or cleared by the [place] FDA and has been authorized for detection and/or diagnosis of SARS-CoV-2 by FDA under an Emergency Use Authorization (EUA).  This EUA will remain in effect (meaning this test can be used) for the duration of the COVID-19 declaration under Section 564(b)(1) of the Act, 21 U.S.C. section 360bbb-3(b)(1), unless the authorization is terminated or revoked sooner. Performed at  [facility], [place] [address] [place], [place] [address]      Discharge Instructions:   Discharge Instructions    Diet - low sodium heart healthy   Complete by: As directed    Increase activity slowly   Complete by: As directed    Increase activity slowly   Complete by: As directed      Allergies as of 02/04/2019      Reactions   Sulfa Antibiotics Rash      Medication List    TAKE these medications   acetaminophen 500 MG tablet Commonly known as: TYLENOL Take 500 mg by mouth daily with breakfast.   diclofenac sodium 1 % Gel Commonly known as: VOLTAREN Apply 2 g topically 4 (four) times daily.   ezetimibe 10 MG tablet Commonly known as: ZETIA Take 10 mg by mouth daily.   famotidine 40 MG tablet Commonly known as: PEPCID Take 40 mg by mouth daily.   gabapentin 300 MG capsule Commonly known as:  NEURONTIN Take 300 mg by mouth every evening.   SYSTANE OP Place 1 drop into both eyes 3 (three) times daily as needed (for dry eyes).   Vitamin D-3 25 MCG (1000 UT) Caps Take 1,000 Units by mouth daily.      Follow-up Information    Chesley Noon, MD Follow up in 1 week(s).   Specialty: Family Medicine Contact information: [place] [address] [phone]            Time coordinating discharge: 25 min  Signed:  Geradine Girt DO  [facility] 02/04/2019, 10:37 AM

## 2019-04-13 ENCOUNTER — Other Ambulatory Visit: Payer: Self-pay | Admitting: Urology

## 2019-04-16 ENCOUNTER — Other Ambulatory Visit: Payer: Self-pay | Admitting: Urology

## 2019-04-20 ENCOUNTER — Ambulatory Visit: Payer: Medicare Other | Admitting: Cardiovascular Disease

## 2019-05-22 ENCOUNTER — Other Ambulatory Visit (HOSPITAL_COMMUNITY)
Admission: RE | Admit: 2019-05-22 | Discharge: 2019-05-22 | Disposition: A | Discharge status: Home / Self Care | Payer: Medicare Other | Source: Ambulatory Visit | Attending: Urology | Admitting: Urology

## 2019-05-22 DIAGNOSIS — Z01812 Encounter for preprocedural laboratory examination: Secondary | ICD-10-CM | POA: Insufficient documentation

## 2019-05-22 DIAGNOSIS — Z20828 Contact with and (suspected) exposure to other viral communicable diseases: Secondary | ICD-10-CM | POA: Diagnosis not present

## 2019-05-22 NOTE — Patient Instructions (Signed)
DUE TO COVID-19 ONLY ONE VISITOR IS ALLOWED TO COME WITH YOU AND STAY IN THE WAITING ROOM ONLY DURING PRE OP AND PROCEDURE DAY OF SURGERY. THE 1 VISITOR MAY VISIT WITH YOU AFTER SURGERY IN YOUR PRIVATE ROOM DURING VISITING HOURS ONLY!  Y ONCE YOUR COVID TEST IS COMPLETED, PLEASE BEGIN THE QUARANTINE INSTRUCTIONS AS OUTLINED IN YOUR HANDOUT.                [name]    Your procedure is scheduled on: 05/25/19   Report to [facility] Main  Entrance   Report to admitting at   10:30 AM     Call this number if you have problems the morning of surgery [phone]    Remember: Do not eat food or drink liquids :After Midnight.   BRUSH YOUR TEETH MORNING OF SURGERY AND RINSE YOUR MOUTH OUT, NO CHEWING GUM CANDY OR MINTS.     Take these medicines the morning of surgery with A SIP OF WATER: Zyrtec, Pepcid, eye drops                                 You may not have any metal on your body including hair pins and              piercings  Do not wear jewelry, make-up, lotions, powders or perfumes, deodorant             Do not wear nail polish on your fingernails.  Do not shave  48 hours prior to surgery.             .   Do not bring valuables to the hospital. [place].  Contacts, dentures or bridgework may not be worn into surgery.       OURS.  Name and phone number of your driver:  Special Instructions: N/A              Please read over the following fact sheets you were given: _____________________________________________________________________             [facility] - Preparing for Surgery  Before surgery, you can play an important role.   Because skin is not sterile, your skin needs to be as free of germs as possible.   You can reduce the number of germs on your skin by washing with CHG (chlorahexidine gluconate) soap before surgery .  CHG is an antiseptic cleaner which kills germs and bonds with the skin to  continue killing germs even after washing. Please DO NOT use if you have an allergy to CHG or antibacterial soaps.   If your skin becomes reddened/irritated stop using the CHG and inform your nurse when you arrive at Short Stay. Do not shave (including legs and underarms) for at least 48 hours prior to the first CHG shower.    Please follow these instructions carefully:  1.  Shower with CHG Soap the night before surgery and the  morning of Surgery.  2.  If you choose to wash your hair, wash your hair first as usual with your  normal  shampoo.  3.  After you shampoo, rinse your hair and body thoroughly to remove the  shampoo.  4.  Use CHG as you would any other liquid soap.  You can apply chg directly  to the skin and wash                       Gently with a scrungie or clean washcloth.  5.  Apply the CHG Soap to your body ONLY FROM THE NECK DOWN.   Do not use on face/ open                           Wound or open sores. Avoid contact with eyes, ears mouth and genitals (private parts).                       Wash face,  Genitals (private parts) with your normal soap.             6.  Wash thoroughly, paying special attention to the area where your surgery  will be performed.  7.  Thoroughly rinse your body with warm water from the neck down.  8.  DO NOT shower/wash with your normal soap after using and rinsing off  the CHG Soap.             9.  Pat yourself dry with a clean towel.            10.  Wear clean pajamas.            11.  Place clean sheets on your bed the night of your first shower and do not  sleep with pets. Day of Surgery : Do not apply any lotions/deodorants the morning of surgery.  Please wear clean clothes to the hospital/surgery center.  FAILURE TO FOLLOW THESE INSTRUCTIONS MAY RESULT IN THE CANCELLATION OF YOUR SURGERY PATIENT SIGNATURE_________________________________  NURSE  SIGNATURE__________________________________  ________________________________________________________________________   Adam Phenix  An incentive spirometer is a tool that can help keep your lungs clear and active. This tool measures how well you are filling your lungs with each breath. Taking long deep breaths may help reverse or decrease the chance of developing breathing (pulmonary) problems (especially infection) following:  A long period of time when you are unable to move or be active. BEFORE THE PROCEDURE   If the spirometer includes an indicator to show your best effort, your nurse or respiratory therapist will set it to a desired goal.  If possible, sit up straight or lean slightly forward. Try not to slouch.  Hold the incentive spirometer in an upright position. INSTRUCTIONS FOR USE  1. Sit on the edge of your bed if possible, or sit up as far as you can in bed or on a chair. 2. Hold the incentive spirometer in an upright position. 3. Breathe out normally. 4. Place the mouthpiece in your mouth and seal your lips tightly around it. 5. Breathe in slowly and as deeply as possible, raising the piston or the ball toward the top of the column. 6. Hold your breath for 3-5 seconds or for as long as possible. Allow the piston or ball to fall to the bottom of the column. 7. Remove the mouthpiece from your mouth and breathe out normally. 8. Rest for a few seconds and repeat Steps 1 through 7 at least 10 times every 1-2 hours when you are awake. Take your time and take a few normal breaths between deep breaths. 9. The spirometer may include an indicator to show your best effort.  Use the indicator as a goal to work toward during each repetition. 10. After each set of 10 deep breaths, practice coughing to be sure your lungs are clear. If you have an incision (the cut made at the time of surgery), support your incision when coughing by placing a pillow or rolled up towels firmly  against it. Once you are able to get out of bed, walk around indoors and cough well. You may stop using the incentive spirometer when instructed by your caregiver.  RISKS AND COMPLICATIONS  Take your time so you do not get dizzy or light-headed.  If you are in pain, you may need to take or ask for pain medication before doing incentive spirometry. It is harder to take a deep breath if you are having pain. AFTER USE  Rest and breathe slowly and easily.  It can be helpful to keep track of a log of your progress. Your caregiver can provide you with a simple table to help with this. If you are using the spirometer at home, follow these instructions: [place] IF:   You are having difficultly using the spirometer.  You have trouble using the spirometer as often as instructed.  Your pain medication is not giving enough relief while using the spirometer.  You develop fever of 100.5 F (38.1 C) or higher. SEEK IMMEDIATE MEDICAL CARE IF:   You cough up bloody sputum that had not been present before.  You develop fever of 102 F (38.9 C) or greater.  You develop worsening pain at or near the incision site. MAKE SURE YOU:   Understand these instructions.  Will watch your condition.  Will get help right away if you are not doing well or get worse. Document Released: 11/01/2006 Document Revised: 09/13/2011 Document Reviewed: 01/02/2007 [facility] Patient Information 2014 [place], [place].   ________________________________________________________________________

## 2019-05-23 ENCOUNTER — Encounter (HOSPITAL_COMMUNITY): Payer: Self-pay

## 2019-05-23 ENCOUNTER — Other Ambulatory Visit: Payer: Self-pay

## 2019-05-23 ENCOUNTER — Encounter (HOSPITAL_COMMUNITY)
Admission: RE | Admit: 2019-05-23 | Discharge: 2019-05-23 | Disposition: A | Discharge status: Home / Self Care | Payer: Medicare Other | Source: Ambulatory Visit | Attending: Urology | Admitting: Urology

## 2019-05-23 DIAGNOSIS — I442 Atrioventricular block, complete: Secondary | ICD-10-CM | POA: Diagnosis not present

## 2019-05-23 DIAGNOSIS — Z01818 Encounter for other preprocedural examination: Secondary | ICD-10-CM | POA: Insufficient documentation

## 2019-05-23 DIAGNOSIS — I4510 Unspecified right bundle-branch block: Secondary | ICD-10-CM | POA: Insufficient documentation

## 2019-05-23 DIAGNOSIS — E785 Hyperlipidemia, unspecified: Secondary | ICD-10-CM | POA: Diagnosis not present

## 2019-05-23 DIAGNOSIS — Z950 Presence of cardiac pacemaker: Secondary | ICD-10-CM | POA: Diagnosis not present

## 2019-05-23 DIAGNOSIS — N2889 Other specified disorders of kidney and ureter: Secondary | ICD-10-CM | POA: Diagnosis not present

## 2019-05-23 DIAGNOSIS — Z79899 Other long term (current) drug therapy: Secondary | ICD-10-CM | POA: Insufficient documentation

## 2019-05-23 DIAGNOSIS — K219 Gastro-esophageal reflux disease without esophagitis: Secondary | ICD-10-CM | POA: Diagnosis not present

## 2019-05-23 DIAGNOSIS — Z87891 Personal history of nicotine dependence: Secondary | ICD-10-CM | POA: Diagnosis not present

## 2019-05-23 DIAGNOSIS — Z8542 Personal history of malignant neoplasm of other parts of uterus: Secondary | ICD-10-CM | POA: Insufficient documentation

## 2019-05-23 DIAGNOSIS — I2510 Atherosclerotic heart disease of native coronary artery without angina pectoris: Secondary | ICD-10-CM | POA: Insufficient documentation

## 2019-05-23 LAB — CBC
HCT: 46.6 % — ABNORMAL HIGH (ref 36.0–46.0)
Hemoglobin: 15 g/dL (ref 12.0–15.0)
MCH: 29.8 pg (ref 26.0–34.0)
MCHC: 32.2 g/dL (ref 30.0–36.0)
MCV: 92.5 fL (ref 80.0–100.0)
Platelets: 214 10*3/uL (ref 150–400)
RBC: 5.04 MIL/uL (ref 3.87–5.11)
RDW: 14.6 % (ref 11.5–15.5)
WBC: 5.9 10*3/uL (ref 4.0–10.5)
nRBC: 0 % (ref 0.0–0.2)

## 2019-05-23 LAB — COMPREHENSIVE METABOLIC PANEL
ALT: 21 U/L (ref 0–44)
AST: 20 U/L (ref 15–41)
Albumin: 4.3 g/dL (ref 3.5–5.0)
Alkaline Phosphatase: 55 U/L (ref 38–126)
Anion gap: 9 (ref 5–15)
BUN: 14 mg/dL (ref 8–23)
CO2: 27 mmol/L (ref 22–32)
Calcium: 9.7 mg/dL (ref 8.9–10.3)
Chloride: 105 mmol/L (ref 98–111)
Creatinine, Ser: 0.71 mg/dL (ref 0.44–1.00)
GFR calc Af Amer: >60 mL/min (ref >60)
GFR calc non Af Amer: >60 mL/min (ref >60)
Glucose, Bld: 101 mg/dL — ABNORMAL HIGH (ref 70–99)
Potassium: 4 mmol/L (ref 3.5–5.1)
Sodium: 141 mmol/L (ref 135–145)
Total Bilirubin: 0.6 mg/dL (ref 0.3–1.2)
Total Protein: 7.4 g/dL (ref 6.5–8.1)

## 2019-05-23 LAB — PROTIME-INR
INR: 1 (ref 0.8–1.2)
Prothrombin Time: 12.8 seconds (ref 11.4–15.2)

## 2019-05-23 LAB — NOVEL CORONAVIRUS, NAA (HOSP ORDER, SEND-OUT TO REF LAB; TAT 18-24 HRS): SARS-CoV-2, NAA: NOT DETECTED

## 2019-05-23 LAB — ABO/RH: ABO/RH(D): A POS

## 2019-05-23 NOTE — Progress Notes (Signed)
PCP - Dr. Diamantina Monks Cardiologist - Dr. Metta Clines  Chest x-ray - 02/03/19 EKG - 02/05/19 Stress Test - no ECHO - no Cardiac Cath -2009   Sleep Study - no CPAP -   Fasting Blood Sugar - NA Checks Blood Sugar _____ times a day  Blood Thinner Instructions:NA Aspirin Instructions: Last Dose:  Anesthesia review:   Patient denies shortness of breath, fever, cough and chest pain at PAT appointment  yes Patient verbalized understanding of instructions that were given to them at the PAT appointment. Patient was also instructed that they will need to review over the PAT instructions again at home before surgery. yes

## 2019-05-24 NOTE — Anesthesia Preprocedure Evaluation (Addendum)
Anesthesia Evaluation  Patient identified by MRN, date of birth, ID band Patient awake    Reviewed: Allergy & Precautions, NPO status , Patient's Chart, lab work & pertinent test results  History of Anesthesia Complications (+) PONV and history of anesthetic complications  Airway Mallampati: II  TM Distance: >3 FB Neck ROM: Full    Dental  (+) Teeth Intact, Dental Advisory Given   Pulmonary neg pulmonary ROS, former smoker,  30 pack year history, quit 2012   Pulmonary exam normal breath sounds clear to auscultation       Cardiovascular + CAD  Normal cardiovascular exam+ dysrhythmias + pacemaker  Rhythm:Regular Rate:Normal  Last echo 2016: Left ventricle: The cavity size was normal. Systolic function was normal. The estimated ejection fraction was in the range of 60% to 65%. Wall motion was normal; there were no regional wall motion abnormalities. Doppler parameters are consistent with abnormal left ventricular relaxation (grade 1 diastolic dysfunction). - Aortic valve: Cusp separation was reduced. Transvalvular velocity was minimally increased. There was no stenosis. Peak velocity (S): 224 cm/s. Mean gradient (S): 9 mm Hg. - Mitral valve: Calcified annulus. There was mild regurgitation. - Left atrium: The atrium was mildly dilated.  CHB s/p PPM 2016  Echo 2017: Nuclear stress EF: 71%. The left ventricular ejection fraction is hyperdynamic (>65%). There was no ST segment deviation noted during stress. The study is normal. There is no evidence of ischemia. This is a low risk study.   Neuro/Psych  Headaches, PSYCHIATRIC DISORDERS Anxiety    GI/Hepatic negative GI ROS, Neg liver ROS, GERD  Medicated and Controlled,  Endo/Other  negative endocrine ROS  Renal/GU Left renal mass   Hx uterine cancer 1978    Musculoskeletal  (+) Arthritis , Osteoarthritis,  Chronic LBP   Abdominal Normal abdominal exam  (+)   Peds negative  pediatric ROS (+)  Hematology negative hematology ROS (+) hct 46.6, plt 214   Anesthesia Other Findings HLD  PONV only with some procedures in remote past  Reproductive/Obstetrics negative OB ROS                                                            Anesthesia Evaluation  Patient identified by MRN, date of birth, ID band Patient awake    Reviewed: Allergy & Precautions, H&P , NPO status , Patient's Chart, lab work & pertinent test results  History of Anesthesia Complications (+) PONV  Airway Mallampati: II  TM Distance: >3 FB Neck ROM: Full    Dental no notable dental hx. (+) Teeth Intact, Dental Advisory Given   Pulmonary neg pulmonary ROS, former smoker,    Pulmonary exam normal breath sounds clear to auscultation       Cardiovascular Exercise Tolerance: Good + CAD and + Peripheral Vascular Disease  + dysrhythmias + pacemaker + Valvular Problems/Murmurs  Rhythm:Regular Rate:Normal     Neuro/Psych  Headaches, Anxiety negative psych ROS   GI/Hepatic Neg liver ROS, GERD  Medicated and Controlled,  Endo/Other  negative endocrine ROS  Renal/GU negative Renal ROS  negative genitourinary   Musculoskeletal  (+) Arthritis , Osteoarthritis,    Abdominal   Peds  Hematology negative hematology ROS (+)   Anesthesia Other Findings   Reproductive/Obstetrics negative OB ROS  Anesthesia Physical Anesthesia Plan  ASA: III  Anesthesia Plan: General   Post-op Pain Management:  Regional for Post-op pain   Induction: Intravenous  PONV Risk Score and Plan: 4 or greater and Ondansetron, Dexamethasone and Midazolam  Airway Management Planned: LMA  Additional Equipment:   Intra-op Plan:   Post-operative Plan: Extubation in OR  Informed Consent: I have reviewed the patients History and Physical, chart, labs and discussed the procedure including the risks, benefits and  alternatives for the proposed anesthesia with the patient or authorized representative who has indicated his/her understanding and acceptance.   Dental advisory given  Plan Discussed with: CRNA  Anesthesia Plan Comments:         Anesthesia Quick Evaluation  Anesthesia Physical Anesthesia Plan  ASA: III  Anesthesia Plan: General   Post-op Pain Management:    Induction: Intravenous  PONV Risk Score and Plan: 4 or greater and Ondansetron, Dexamethasone and Treatment may vary due to age or medical condition  Airway Management Planned: Oral ETT  Additional Equipment: None  Intra-op Plan:   Post-operative Plan: Extubation in OR  Informed Consent: I have reviewed the patients History and Physical, chart, labs and discussed the procedure including the risks, benefits and alternatives for the proposed anesthesia with the patient or authorized representative who has indicated his/her understanding and acceptance.     Dental advisory given  Plan Discussed with: CRNA  Anesthesia Plan Comments: (Per patient PM needs to be interrogated prior to surgery, will call rep in)       Anesthesia Quick Evaluation

## 2019-05-24 NOTE — Progress Notes (Signed)
Anesthesia Chart Review   Case: [id number] Date/Time: 05/25/19 1200   Procedure: XI ROBOTIC ASSITED PARTIAL NEPHRECTOMY (Left )   Anesthesia type: General   Pre-op diagnosis: LEFT RENAL MASS   Location: [place] [facility] / [facility]   Surgeon: Ceasar Mons, MD      DISCUSSION:73 y.o. former smoker (30 pack years, quit 08/05/10) with h/o GERD, PONV, HLD, RBBB, CHB with pace maker in place (last device check 05/04/2019), nonobstructive CAD, AAA, left renal mass scheduled for above procedure 05/25/2019 with Dr. Harrell Gave Lovena Neighbours.   Pt admitted 8/1-02/04/2019 due to chest pain, reproducible TTP, troponin negative x2, seen by cardiology with no further ischemic workup recommended, low suspicion for ACS.    Last seen by cardiologist, Dr. Lenis Noon, 04/19/2019.  Per OV note, "Ms. 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  Dr. Rosana Hoes is outlined her recent and past medical and cardiac history.  She has resumed working out at [employer] without angina or dyspnea.  Her rhythm is paced and her cardiac exam is unremarkable.  She will continue her current medications and can undergo her proposed surgery without further cardiac evaluation."  Device orders requested.  VS: BP (!) 124/54   Pulse 76   Temp 36.4 C (Oral)   Resp 16   Ht _0  (1.651 m)   Wt 86.3 kg   SpO2 100%   BMI 31.67 kg/m   PROVIDERS: Chesley Noon, MD is PCP   Elayne Guerin, MD is Cardiologist  LABS: Labs reviewed: Acceptable for surgery. (all labs ordered are listed, but only abnormal results are displayed)  Labs Reviewed  CBC - Abnormal; Notable for the following components:      Result Value   HCT 46.6 (*)    All other components within normal limits  COMPREHENSIVE METABOLIC PANEL - Abnormal; Notable for the following components:   Glucose, Bld 101 (*)    All other components within normal limits  PROTIME-INR  TYPE AND  SCREEN  ABO/RH     IMAGES: CT Angio Chest 02/03/2019 IMPRESSION: Chest Impression:  1. No evidence of aortic dissection or aneurysm. 2. Coronary artery calcification and Aortic Atherosclerosis (ICD10-I70.0).  Abdomen / Pelvis Impression:  1. No abdominal aortic aneurysm or dissection. 2. 5 cm sclerotic lesion in the RIGHT iliac bone presumably related to prior bone harvesting. 3. Enhancing lesion in the mid LEFT kidney is consistent with a solid renal neoplasm (benign or malignant). Recommend non emergent urology consultation.  EKG: 02/05/2019 Rate 63 bpm Sinus rhythm  Prolonged PR interval  Nonspecific IVCD with LAD Left ventricular hypertrophy Inferior infarct, acute Anterior infarct, old Lateral leads are also involved Probable RV involvement, suggest recording right precordial leads  CV: Myocardial Perfusion Imaging 09/09/2015  Nuclear stress EF: 71%.  The left ventricular ejection fraction is hyperdynamic (>65%).  There was no ST segment deviation noted during stress.  The study is normal. There is no evidence of ischemia  This is a low risk study.  Echo 04/30/2015 Study Conclusions  - Left ventricle: The cavity size was normal. Systolic function was   normal. The estimated ejection fraction was in the range of 60%   to 65%. Wall motion was normal; there were no regional wall   motion abnormalities. Doppler parameters are consistent with   abnormal left ventricular relaxation (grade 1 diastolic   dysfunction). - Aortic valve: Cusp separation was reduced. Transvalvular velocity   was  minimally increased. There was no stenosis. Peak velocity   (S): 224 cm/s. Mean gradient (S): 9 mm Hg. - Mitral valve: Calcified annulus. There was mild regurgitation. - Left atrium: The atrium was mildly dilated. Past Medical History:  Diagnosis Date  . AAA (abdominal aortic aneurysm) ([place])   . Anxiety   . Aortic stenosis    mild by 01/13/16 echo at [facility]  .  Arthritis    OA- hands, back, knees   . CAD (coronary artery disease)    a. LHC 8/09 - mLAD myocardial bridge, pLCx 52, mRCA 54  //  b. Myoview 5/11 - No ischemia or scar, breast attenuation, EF 67%, low risk  //  c. Lexiscan Myoview 3/17: EF 71%, no ischemia, low risk  . Cancer ([place]) 1978   uterine  CA  . Carotid stenosis    a. Carotid US 7/15 - +plaque, no significant ICA stenosis  . Chronic pain    low back  . Complete heart block ([place]) 05/23/2015   s/p Pacemaker 11/16  . GERD (gastroesophageal reflux disease)   . Headache    h/o of migraines long time ago  . Heart murmur   . History of echocardiogram    a. Ech 10/16: EF 60-65%, normal wall motion, grade 1 diastolic dysfunction, aortic valve cusp separation reduced, no stenosis, mean gradient 9 mmHg, MAC, mild MR, mild LAE  . Hx of syncope 05/24/2015  . Hyperlipidemia   . Migraines   . PONV (postoperative nausea and vomiting)   . Presence of permanent cardiac pacemaker    MDT PPM implant 05/24/15 Dr. Curt Bears, lead revision 07/10/15  . 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 ABDOMINAL HYSTERECTOMY    . APPENDECTOMY    . BACK SURGERY  2009   x3 surgeries, last one - fusion   . BREAST EXCISIONAL BIOPSY Left 2008   benign  . CARDIAC CATHETERIZATION  02/22/2008   no significant CAD, 50% narrowing of proximal mid-dom. right Coronary artery. medical therapy along with antiplatlet therapy   . EP IMPLANTABLE DEVICE N/A 05/23/2015   Procedure: Pacemaker Implant;  Surgeon: Will Meredith Leeds, MD;  Location: [place] [facility];  Service: Cardiovascular;  Laterality: N/A;  . EP IMPLANTABLE DEVICE N/A 07/10/2015   Procedure: Lead Revision Pacemaker;  Surgeon: Will Meredith Leeds, MD;  Location: [place] [facility];  Service: Cardiovascular;  Laterality: N/A;  . INSERT / REPLACE / REMOVE PACEMAKER  10/2015   last one, done at [facility]   .  NM MYOCAR PERF WALL MOTION  11/03/2009   protocol: Persantine, no evidence of ischemia/infarct, post EF67%  . PARTIAL KNEE ARTHROPLASTY Left 08/02/2017   Procedure: UNICOMPARTMENTAL KNEE;  Surgeon: Renette Butters, MD;  Location: [place];  Service: Orthopedics;  Laterality: Left;  . REPLACEMENT UNICONDYLAR JOINT KNEE Left 08/02/2017  . TONSILLECTOMY    . TRANSTHORACIC ECHOCARDIOGRAM  11/12/2010   EF=>55%, moderate calcification of aortic valve leaflets, no MVP seen, normal LV thickness     MEDICATIONS: . acetaminophen (TYLENOL) 500 MG tablet  . Camphor-Menthol-Methyl Sal (SALONPAS) 3.07-10-08 % PTCH  . cetirizine (ZYRTEC) 10 MG tablet  . Cholecalciferol (VITAMIN D-3) 125 MCG (5000 UT) TABS  . ezetimibe (ZETIA) 10 MG tablet  . famotidine (PEPCID) 40 MG tablet  . gabapentin (NEURONTIN) 300 MG capsule  . Multiple Vitamins-Minerals (MULTIVITAMIN WITH MINERALS) tablet  . Polyethyl Glycol-Propyl Glycol (SYSTANE OP)  . tretinoin (  RETIN-A) 0.025 % cream   No current facility-administered medications for this encounter.      Maia Plan [facility] Pre-Surgical Testing [phone] 05/24/19  3:01 PM

## 2019-05-25 ENCOUNTER — Observation Stay (HOSPITAL_COMMUNITY)
Admission: RE | Admit: 2019-05-25 | Discharge: 2019-05-26 | Disposition: A | Discharge status: Home / Self Care | Payer: Medicare Other | Attending: Urology | Admitting: Urology

## 2019-05-25 ENCOUNTER — Ambulatory Visit (HOSPITAL_COMMUNITY): Payer: Medicare Other | Admitting: Anesthesiology

## 2019-05-25 ENCOUNTER — Encounter (HOSPITAL_COMMUNITY): Payer: Self-pay | Admitting: *Deleted

## 2019-05-25 ENCOUNTER — Other Ambulatory Visit: Payer: Self-pay

## 2019-05-25 ENCOUNTER — Encounter (HOSPITAL_COMMUNITY)
Admission: RE | Disposition: A | Discharge status: Home / Self Care | Payer: Self-pay | Source: Home / Self Care | Attending: Urology

## 2019-05-25 ENCOUNTER — Ambulatory Visit (HOSPITAL_COMMUNITY): Payer: Medicare Other | Admitting: Physician Assistant

## 2019-05-25 DIAGNOSIS — Z87891 Personal history of nicotine dependence: Secondary | ICD-10-CM | POA: Diagnosis not present

## 2019-05-25 DIAGNOSIS — I2510 Atherosclerotic heart disease of native coronary artery without angina pectoris: Secondary | ICD-10-CM | POA: Diagnosis not present

## 2019-05-25 DIAGNOSIS — Z96652 Presence of left artificial knee joint: Secondary | ICD-10-CM | POA: Insufficient documentation

## 2019-05-25 DIAGNOSIS — C642 Malignant neoplasm of left kidney, except renal pelvis: Principal | ICD-10-CM | POA: Insufficient documentation

## 2019-05-25 DIAGNOSIS — N2889 Other specified disorders of kidney and ureter: Secondary | ICD-10-CM | POA: Diagnosis present

## 2019-05-25 DIAGNOSIS — Z950 Presence of cardiac pacemaker: Secondary | ICD-10-CM | POA: Insufficient documentation

## 2019-05-25 DIAGNOSIS — Z8542 Personal history of malignant neoplasm of other parts of uterus: Secondary | ICD-10-CM | POA: Insufficient documentation

## 2019-05-25 DIAGNOSIS — K219 Gastro-esophageal reflux disease without esophagitis: Secondary | ICD-10-CM | POA: Diagnosis not present

## 2019-05-25 DIAGNOSIS — I714 Abdominal aortic aneurysm, without rupture: Secondary | ICD-10-CM | POA: Diagnosis not present

## 2019-05-25 HISTORY — PX: ROBOTIC ASSITED PARTIAL NEPHRECTOMY: SHX6087

## 2019-05-25 LAB — HEMOGLOBIN AND HEMATOCRIT, BLOOD
HCT: 43.6 % (ref 36.0–46.0)
Hemoglobin: 14 g/dL (ref 12.0–15.0)

## 2019-05-25 LAB — TYPE AND SCREEN
ABO/RH(D): A POS
Antibody Screen: NEGATIVE

## 2019-05-25 SURGERY — NEPHRECTOMY, PARTIAL, ROBOT-ASSISTED
Anesthesia: General | Laterality: Left

## 2019-05-25 MED ORDER — MIDAZOLAM HCL 2 MG/2ML IJ SOLN
INTRAMUSCULAR | Status: AC
  Filled 2019-05-25: qty 2

## 2019-05-25 MED ORDER — DIPHENHYDRAMINE HCL 12.5 MG/5ML PO ELIX
12.5000 mg | ORAL_SOLUTION | Freq: Four times a day (QID) | ORAL | Status: DC | PRN
  Filled 2019-05-25: qty 5

## 2019-05-25 MED ORDER — ROCURONIUM BROMIDE 10 MG/ML (PF) SYRINGE
PREFILLED_SYRINGE | INTRAVENOUS | Status: AC
  Filled 2019-05-25: qty 10

## 2019-05-25 MED ORDER — PROMETHAZINE HCL 25 MG/ML IJ SOLN
12.5000 mg | INTRAMUSCULAR | Status: DC | PRN
  Administered 2019-05-25: 12.5 mg via INTRAVENOUS
  Filled 2019-05-25: qty 1

## 2019-05-25 MED ORDER — BUPIVACAINE LIPOSOME 1.3 % IJ SUSP
20.0000 mL | Freq: Once | INTRAMUSCULAR | Status: AC
  Administered 2019-05-25: 20 mL
  Filled 2019-05-25: qty 20

## 2019-05-25 MED ORDER — OXYCODONE HCL 5 MG PO TABS: 5.0000 mg | ORAL_TABLET | ORAL | Status: DC | PRN

## 2019-05-25 MED ORDER — PROPOFOL 10 MG/ML IV BOLUS
INTRAVENOUS | Status: DC | PRN
  Administered 2019-05-25: 150 mg via INTRAVENOUS

## 2019-05-25 MED ORDER — ONDANSETRON HCL 4 MG/2ML IJ SOLN
4.0000 mg | Freq: Once | INTRAMUSCULAR | Status: AC | PRN
  Administered 2019-05-25: 4 mg via INTRAVENOUS

## 2019-05-25 MED ORDER — GABAPENTIN 300 MG PO CAPS
300.0000 mg | ORAL_CAPSULE | Freq: Every evening | ORAL | Status: DC
  Administered 2019-05-25: 300 mg via ORAL
  Filled 2019-05-25: qty 1

## 2019-05-25 MED ORDER — ACETAMINOPHEN 500 MG PO TABS: 1000.0000 mg | ORAL_TABLET | Freq: Once | ORAL | Status: DC

## 2019-05-25 MED ORDER — HYDROMORPHONE HCL 1 MG/ML IJ SOLN
INTRAMUSCULAR | Status: AC
  Administered 2019-05-25: 0.5 mg via INTRAVENOUS
  Filled 2019-05-25: qty 1

## 2019-05-25 MED ORDER — PHENYLEPHRINE 40 MCG/ML (10ML) SYRINGE FOR IV PUSH (FOR BLOOD PRESSURE SUPPORT)
PREFILLED_SYRINGE | INTRAVENOUS | Status: AC
  Filled 2019-05-25: qty 20

## 2019-05-25 MED ORDER — LIDOCAINE 2% (20 MG/ML) 5 ML SYRINGE
INTRAMUSCULAR | Status: AC
  Filled 2019-05-25: qty 5

## 2019-05-25 MED ORDER — DOCUSATE SODIUM 100 MG PO CAPS: 100.0000 mg | ORAL_CAPSULE | Freq: Two times a day (BID) | ORAL | Status: AC

## 2019-05-25 MED ORDER — DOCUSATE SODIUM 100 MG PO CAPS
100.0000 mg | ORAL_CAPSULE | Freq: Two times a day (BID) | ORAL | Status: DC
  Administered 2019-05-25 – 2019-05-26 (×2): 100 mg via ORAL
  Filled 2019-05-25 (×2): qty 1

## 2019-05-25 MED ORDER — ONDANSETRON HCL 4 MG/2ML IJ SOLN
INTRAMUSCULAR | Status: DC | PRN
  Administered 2019-05-25: 4 mg via INTRAVENOUS

## 2019-05-25 MED ORDER — BUPIVACAINE-EPINEPHRINE 0.25% -1:200000 IJ SOLN
INTRAMUSCULAR | Status: DC | PRN
  Administered 2019-05-25: 30 mL

## 2019-05-25 MED ORDER — EZETIMIBE 10 MG PO TABS
10.0000 mg | ORAL_TABLET | Freq: Every day | ORAL | Status: DC
  Administered 2019-05-25 – 2019-05-26 (×2): 10 mg via ORAL
  Filled 2019-05-25 (×2): qty 1

## 2019-05-25 MED ORDER — PROMETHAZINE HCL 12.5 MG PO TABS: 12.5000 mg | ORAL_TABLET | ORAL | 0 refills | Status: AC | PRN

## 2019-05-25 MED ORDER — PHENYLEPHRINE 40 MCG/ML (10ML) SYRINGE FOR IV PUSH (FOR BLOOD PRESSURE SUPPORT)
PREFILLED_SYRINGE | INTRAVENOUS | Status: DC | PRN
  Administered 2019-05-25: 80 ug via INTRAVENOUS

## 2019-05-25 MED ORDER — PHENYLEPHRINE HCL (PRESSORS) 10 MG/ML IV SOLN
INTRAVENOUS | Status: DC | PRN
  Administered 2019-05-25: 40 ug via INTRAVENOUS

## 2019-05-25 MED ORDER — ONDANSETRON HCL 4 MG/2ML IJ SOLN
INTRAMUSCULAR | Status: AC
  Filled 2019-05-25: qty 2

## 2019-05-25 MED ORDER — SCOPOLAMINE 1 MG/3DAYS TD PT72: 1.0000 | MEDICATED_PATCH | TRANSDERMAL | Status: DC

## 2019-05-25 MED ORDER — HYDROMORPHONE HCL 1 MG/ML IJ SOLN
INTRAMUSCULAR | Status: AC
  Filled 2019-05-25: qty 1

## 2019-05-25 MED ORDER — BELLADONNA ALKALOIDS-OPIUM 16.2-60 MG RE SUPP: 1.0000 | Freq: Four times a day (QID) | RECTAL | Status: DC | PRN

## 2019-05-25 MED ORDER — SUGAMMADEX SODIUM 200 MG/2ML IV SOLN
INTRAVENOUS | Status: DC | PRN
  Administered 2019-05-25: 200 mg via INTRAVENOUS

## 2019-05-25 MED ORDER — PROPOFOL 10 MG/ML IV BOLUS
INTRAVENOUS | Status: AC
  Filled 2019-05-25: qty 20

## 2019-05-25 MED ORDER — HYDROMORPHONE HCL 1 MG/ML IJ SOLN
0.2500 mg | INTRAMUSCULAR | Status: DC | PRN
  Administered 2019-05-25 (×4): 0.5 mg via INTRAVENOUS

## 2019-05-25 MED ORDER — CEFAZOLIN SODIUM-DEXTROSE 2-4 GM/100ML-% IV SOLN
2.0000 g | Freq: Once | INTRAVENOUS | Status: AC
  Administered 2019-05-25: 13:00:00 2 g via INTRAVENOUS
  Filled 2019-05-25: qty 100

## 2019-05-25 MED ORDER — ACETAMINOPHEN 500 MG PO TABS
1000.0000 mg | ORAL_TABLET | Freq: Four times a day (QID) | ORAL | Status: AC
  Administered 2019-05-25 – 2019-05-26 (×4): 1000 mg via ORAL
  Filled 2019-05-25 (×4): qty 2

## 2019-05-25 MED ORDER — LIDOCAINE HCL (CARDIAC) PF 100 MG/5ML IV SOSY
PREFILLED_SYRINGE | INTRAVENOUS | Status: DC | PRN
  Administered 2019-05-25: 40 mg via INTRAVENOUS

## 2019-05-25 MED ORDER — ONDANSETRON HCL 4 MG/2ML IJ SOLN: 4.0000 mg | INTRAMUSCULAR | Status: DC | PRN

## 2019-05-25 MED ORDER — ROCURONIUM BROMIDE 100 MG/10ML IV SOLN
INTRAVENOUS | Status: DC | PRN
  Administered 2019-05-25: 20 mg via INTRAVENOUS
  Administered 2019-05-25: 60 mg via INTRAVENOUS

## 2019-05-25 MED ORDER — PROMETHAZINE HCL 25 MG/ML IJ SOLN: 12.5000 mg | INTRAMUSCULAR | Status: DC | PRN

## 2019-05-25 MED ORDER — SODIUM CHLORIDE (PF) 0.9 % IJ SOLN
INTRAMUSCULAR | Status: AC
  Filled 2019-05-25: qty 50

## 2019-05-25 MED ORDER — BUPIVACAINE-EPINEPHRINE 0.25% -1:200000 IJ SOLN
INTRAMUSCULAR | Status: AC
  Filled 2019-05-25: qty 1

## 2019-05-25 MED ORDER — MIDAZOLAM HCL 5 MG/5ML IJ SOLN
INTRAMUSCULAR | Status: DC | PRN
  Administered 2019-05-25: 1 mg via INTRAVENOUS

## 2019-05-25 MED ORDER — DIPHENHYDRAMINE HCL 50 MG/ML IJ SOLN: 12.5000 mg | Freq: Four times a day (QID) | INTRAMUSCULAR | Status: DC | PRN

## 2019-05-25 MED ORDER — DEXAMETHASONE SODIUM PHOSPHATE 10 MG/ML IJ SOLN
INTRAMUSCULAR | Status: AC
  Filled 2019-05-25: qty 1

## 2019-05-25 MED ORDER — EVICEL 5 ML EX KIT
PACK | Freq: Once | CUTANEOUS | Status: AC
  Administered 2019-05-25: 1
  Filled 2019-05-25: qty 1

## 2019-05-25 MED ORDER — HYDROCODONE-ACETAMINOPHEN 5-325 MG PO TABS: 1.0000 | ORAL_TABLET | Freq: Four times a day (QID) | ORAL | 0 refills | Status: AC | PRN

## 2019-05-25 MED ORDER — LACTATED RINGERS IV SOLN
INTRAVENOUS | Status: DC | PRN
  Administered 2019-05-25 (×3): via INTRAVENOUS

## 2019-05-25 MED ORDER — CEFAZOLIN SODIUM-DEXTROSE 1-4 GM/50ML-% IV SOLN
1.0000 g | Freq: Three times a day (TID) | INTRAVENOUS | Status: AC
  Administered 2019-05-25 – 2019-05-26 (×3): 1 g via INTRAVENOUS
  Filled 2019-05-25 (×3): qty 50

## 2019-05-25 MED ORDER — DEXAMETHASONE SODIUM PHOSPHATE 10 MG/ML IJ SOLN
INTRAMUSCULAR | Status: DC | PRN
  Administered 2019-05-25: 10 mg via INTRAVENOUS

## 2019-05-25 MED ORDER — FENTANYL CITRATE (PF) 250 MCG/5ML IJ SOLN
INTRAMUSCULAR | Status: AC
  Filled 2019-05-25: qty 5

## 2019-05-25 MED ORDER — FAMOTIDINE 20 MG PO TABS
40.0000 mg | ORAL_TABLET | Freq: Every day | ORAL | Status: DC
  Administered 2019-05-25 – 2019-05-26 (×2): 40 mg via ORAL
  Filled 2019-05-25 (×2): qty 2

## 2019-05-25 MED ORDER — FENTANYL CITRATE (PF) 100 MCG/2ML IJ SOLN
INTRAMUSCULAR | Status: DC | PRN
  Administered 2019-05-25: 100 ug via INTRAVENOUS
  Administered 2019-05-25 (×3): 50 ug via INTRAVENOUS

## 2019-05-25 MED ORDER — HYDROMORPHONE HCL 1 MG/ML IJ SOLN: 0.5000 mg | INTRAMUSCULAR | Status: DC | PRN

## 2019-05-25 MED ORDER — DEXTROSE-NACL 5-0.45 % IV SOLN
INTRAVENOUS | Status: DC
  Administered 2019-05-25: 23:00:00 via INTRAVENOUS

## 2019-05-25 MED ORDER — FENTANYL CITRATE (PF) 100 MCG/2ML IJ SOLN: 25.0000 ug | INTRAMUSCULAR | Status: DC | PRN

## 2019-05-25 SURGICAL SUPPLY — 74 items
ADH SKN CLS APL DERMABOND .7 (GAUZE/BANDAGES/DRESSINGS) ×1
APL PRP STRL LF DISP 70% ISPRP (MISCELLANEOUS) ×1
APL SRG 32X5 SNPLK LF DISP (MISCELLANEOUS) ×1
BAG SPEC RTRVL LRG 6X4 10 (ENDOMECHANICALS) ×1
CHLORAPREP W/TINT 26 (MISCELLANEOUS) ×3 IMPLANT
CLIP SUT LAPRA TY ABSORB (SUTURE) ×5 IMPLANT
CLIP VESOLOCK LG 6/CT PURPLE (CLIP) ×6 IMPLANT
CLIP VESOLOCK MED LG 6/CT (CLIP) ×7 IMPLANT
CLIP VESOLOCK XL 6/CT (CLIP) IMPLANT
COVER SURGICAL LIGHT HANDLE (MISCELLANEOUS) ×3 IMPLANT
COVER TIP SHEARS 8 DVNC (MISCELLANEOUS) ×1 IMPLANT
COVER TIP SHEARS 8MM DA VINCI (MISCELLANEOUS) ×2
COVER WAND RF STERILE (DRAPES) IMPLANT
CUTTER ECHEON FLEX ENDO 45 340 (ENDOMECHANICALS) IMPLANT
DECANTER SPIKE VIAL GLASS SM (MISCELLANEOUS) ×3 IMPLANT
DERMABOND ADVANCED (GAUZE/BANDAGES/DRESSINGS) ×2
DERMABOND ADVANCED .7 DNX12 (GAUZE/BANDAGES/DRESSINGS) ×1 IMPLANT
DRAIN CHANNEL 15F RND FF 3/16 (WOUND CARE) IMPLANT
DRAPE ARM DVNC X/XI (DISPOSABLE) ×4 IMPLANT
DRAPE COLUMN DVNC XI (DISPOSABLE) ×1 IMPLANT
DRAPE DA VINCI XI ARM (DISPOSABLE) ×8
DRAPE DA VINCI XI COLUMN (DISPOSABLE) ×2
DRAPE INCISE IOBAN 66X45 STRL (DRAPES) ×3 IMPLANT
DRAPE SHEET LG 3/4 BI-LAMINATE (DRAPES) ×3 IMPLANT
ELECT REM PT RETURN 15FT ADLT (MISCELLANEOUS) ×3 IMPLANT
EVACUATOR SILICONE 100CC (DRAIN) IMPLANT
GLOVE BIO SURGEON STRL SZ 6.5 (GLOVE) ×2 IMPLANT
GLOVE BIO SURGEONS STRL SZ 6.5 (GLOVE) ×1
GLOVE BIOGEL M STRL SZ7.5 (GLOVE) ×6 IMPLANT
GLOVE BIOGEL PI IND STRL 8 (GLOVE) ×2 IMPLANT
GLOVE BIOGEL PI INDICATOR 8 (GLOVE) ×4
GOWN STRL REUS W/TWL LRG LVL3 (GOWN DISPOSABLE) ×3 IMPLANT
GOWN STRL REUS W/TWL XL LVL3 (GOWN DISPOSABLE) ×6 IMPLANT
HEMOSTAT SURGICEL 4X8 (HEMOSTASIS) ×1 IMPLANT
HOLDER FOLEY CATH W/STRAP (MISCELLANEOUS) ×2 IMPLANT
IRRIG SUCT STRYKERFLOW 2 WTIP (MISCELLANEOUS) ×3
IRRIGATION SUCT STRKRFLW 2 WTP (MISCELLANEOUS) ×1 IMPLANT
KIT BASIN OR (CUSTOM PROCEDURE TRAY) ×3 IMPLANT
KIT TURNOVER KIT A (KITS) ×2 IMPLANT
MARKER SKIN DUAL TIP RULER LAB (MISCELLANEOUS) ×3 IMPLANT
NDL INSUFFLATION 14GA 120MM (NEEDLE) ×1 IMPLANT
NEEDLE INSUFFLATION 14GA 120MM (NEEDLE) ×3 IMPLANT
PENCIL SMOKE EVACUATOR (MISCELLANEOUS) IMPLANT
POUCH SPECIMEN RETRIEVAL 10MM (ENDOMECHANICALS) ×3 IMPLANT
PROTECTOR NERVE ULNAR (MISCELLANEOUS) ×4 IMPLANT
RELOAD STAPLE 45 2.6 WHT THIN (STAPLE) IMPLANT
SEAL CANN UNIV 5-8 DVNC XI (MISCELLANEOUS) ×4 IMPLANT
SEAL XI 5MM-8MM UNIVERSAL (MISCELLANEOUS) ×8
SEALANT SURGICAL APPL DUAL CAN (MISCELLANEOUS) ×2 IMPLANT
SET TUBE SMOKE EVAC HIGH FLOW (TUBING) ×3 IMPLANT
SOLUTION ELECTROLUBE (MISCELLANEOUS) ×3 IMPLANT
STAPLE RELOAD 45 WHT (STAPLE) IMPLANT
STAPLE RELOAD 45MM WHITE (STAPLE)
SUT ETHILON 2 0 PSLX (SUTURE) IMPLANT
SUT MNCRL AB 4-0 PS2 18 (SUTURE) ×6 IMPLANT
SUT PDS AB 0 CT1 36 (SUTURE) IMPLANT
SUT V-LOC BARB 180 2/0GR6 GS22 (SUTURE)
SUT VIC AB 1 CT1 27 (SUTURE) ×15
SUT VIC AB 1 CT1 27XBRD ANTBC (SUTURE) ×4 IMPLANT
SUT VIC AB 3-0 SH 27 (SUTURE) ×6
SUT VIC AB 3-0 SH 27XBRD (SUTURE) IMPLANT
SUT VICRYL 0 UR6 27IN ABS (SUTURE) ×2 IMPLANT
SUT VLOC BARB 180 ABS3/0GR12 (SUTURE) ×6
SUTURE V-LC BRB 180 2/0GR6GS22 (SUTURE) IMPLANT
SUTURE VLOC BRB 180 ABS3/0GR12 (SUTURE) ×2 IMPLANT
TOWEL OR 17X26 10 PK STRL BLUE (TOWEL DISPOSABLE) ×3 IMPLANT
TOWEL OR NON WOVEN STRL DISP B (DISPOSABLE) ×3 IMPLANT
TRAY FOLEY MTR SLVR 14FR STAT (SET/KITS/TRAYS/PACK) ×2 IMPLANT
TRAY FOLEY MTR SLVR 16FR STAT (SET/KITS/TRAYS/PACK) ×1 IMPLANT
TRAY LAPAROSCOPIC (CUSTOM PROCEDURE TRAY) ×3 IMPLANT
TROCAR BLADELESS OPT 5 100 (ENDOMECHANICALS) IMPLANT
TROCAR UNIVERSAL OPT 12M 100M (ENDOMECHANICALS) IMPLANT
TROCAR XCEL 12X100 BLDLESS (ENDOMECHANICALS) ×3 IMPLANT
WATER STERILE IRR 1000ML POUR (IV SOLUTION) ×3 IMPLANT

## 2019-05-25 NOTE — Transfer of Care (Signed)
Immediate Anesthesia Transfer of Care Note  Patient: [name]  Procedure(s) Performed: XI ROBOTIC ASSITED PARTIAL NEPHRECTOMY (Left )  Patient Location: PACU  Anesthesia Type:General  Level of Consciousness: drowsy and patient cooperative  Airway & Oxygen Therapy: Patient Spontanous Breathing and Patient connected to face mask oxygen  Post-op Assessment: Report given to RN and Post -op Vital signs reviewed and stable  Post vital signs: Reviewed and stable  Last Vitals:  Vitals Value Taken Time  BP 160/82 05/25/19 1550  Temp    Pulse 84 05/25/19 1553  Resp 16 05/25/19 1553  SpO2 100 % 05/25/19 1553  Vitals shown include unvalidated device data.  Last Pain:  Vitals:   05/25/19 1103  TempSrc: Oral      Patients Stated Pain Goal: 3 ([phone])  Complications: No apparent anesthesia complications

## 2019-05-25 NOTE — H&P (Signed)
Urology Preoperative H&P   Chief Complaint: Left renal mass  History of Present Illness: [name] is a 73 y.o. female with an incidentally identified solid and enhancing 2.4 cm left renal mass with features concerning for RCC.  The mass was discovered on whole body CT angio during a work-up for abdominal pain and diaphoresis on 8/12020.    -Previously smoked 1pp for 35 years--quit 6 years ago  -No personal/family history of GU malignancies  -Denies flank pain or hematuria.   Past Medical History:  Diagnosis Date  . AAA (abdominal aortic aneurysm) ([place])   . Anxiety   . Aortic stenosis    mild by 01/13/16 echo at [facility]  . Arthritis    OA- hands, back, knees   . CAD (coronary artery disease)    a. LHC 8/09 - mLAD myocardial bridge, pLCx 38, mRCA 69  //  b. Myoview 5/11 - No ischemia or scar, breast attenuation, EF 67%, low risk  //  c. Lexiscan Myoview 3/17: EF 71%, no ischemia, low risk  . Cancer ([place]) 1978   uterine  CA  . Carotid stenosis    a. Carotid US 7/15 - +plaque, no significant ICA stenosis  . Chronic pain    low back  . 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 GERD (gastroesophageal reflux disease)   . Headache    h/o of migraines long time ago  . Heart murmur   . History of echocardiogram    a. Ech 10/16: EF 60-65%, normal wall motion, grade 1 diastolic dysfunction, aortic valve cusp separation reduced, no stenosis, mean gradient 9 mmHg, MAC, mild MR, mild LAE  . Hx of syncope 05/24/2015  . Hyperlipidemia   . Migraines   . PONV (postoperative nausea and vomiting)   . Presence of permanent cardiac pacemaker    MDT PPM implant 05/24/15 Dr. Curt Bears, lead revision 07/10/15  . 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 ABDOMINAL HYSTERECTOMY    . APPENDECTOMY    . BACK SURGERY  2009   x3 surgeries, last one - fusion   . BREAST EXCISIONAL BIOPSY Left  2008   benign  . CARDIAC CATHETERIZATION  02/22/2008   no significant CAD, 50% narrowing of proximal mid-dom. right Coronary artery. medical therapy along with antiplatlet therapy   . EP IMPLANTABLE DEVICE N/A 05/23/2015   Procedure: Pacemaker Implant;  Surgeon: Will Meredith Leeds, MD;  Location: [place] [facility];  Service: Cardiovascular;  Laterality: N/A;  . EP IMPLANTABLE DEVICE N/A 07/10/2015   Procedure: Lead Revision Pacemaker;  Surgeon: Will Meredith Leeds, MD;  Location: [place] [facility];  Service: Cardiovascular;  Laterality: N/A;  . INSERT / REPLACE / REMOVE PACEMAKER  10/2015   last one, done at [facility]   . NM MYOCAR PERF WALL MOTION  11/03/2009   protocol: Persantine, no evidence of ischemia/infarct, post EF67%  . PARTIAL KNEE ARTHROPLASTY Left 08/02/2017   Procedure: UNICOMPARTMENTAL KNEE;  Surgeon: Renette Butters, MD;  Location: [place];  Service: Orthopedics;  Laterality: Left;  . REPLACEMENT UNICONDYLAR JOINT KNEE Left 08/02/2017  . TONSILLECTOMY    . TRANSTHORACIC ECHOCARDIOGRAM  11/12/2010   EF=>55%, moderate calcification of aortic valve leaflets, no MVP seen, normal LV thickness     Allergies:  Allergies  Allergen Reactions  . Sulfa Antibiotics Rash    Family History  Problem Relation Age of  Onset  . Cancer Mother   . Rheum arthritis Mother   . Cancer Father        leukemia  . Heart disease Father   . Gout Father   . Hypertension Sister   . Diabetes Brother   . Hyperlipidemia Brother   . Gout Brother   . Hypertension Brother   . Heart disease Maternal Grandmother   . Diabetes Maternal Grandmother   . Diabetes Maternal Grandfather   . Heart disease Maternal Grandfather   . Tuberculosis Paternal Grandmother   . Stroke Paternal Grandfather   . Diabetes Brother   . Hyperlipidemia Brother   . Heart disease Brother   . Hyperlipidemia Son   . Diabetes Maternal Aunt     Social History:  reports that she quit smoking about 8 years ago. Her  smoking use included cigarettes. She has a 30.00 pack-year smoking history. She has never used smokeless tobacco. She reports current alcohol use. She reports that she does not use drugs.  ROS: A complete review of systems was performed.  All systems are negative except for pertinent findings as noted.  Physical Exam:  Vital signs in last 24 hours:   Constitutional:  Alert and oriented, No acute distress Cardiovascular: Regular rate and rhythm, No JVD Respiratory: Normal respiratory effort, Lungs clear bilaterally GI: Abdomen is soft, nontender, nondistended, no abdominal masses GU: No CVA tenderness Lymphatic: No lymphadenopathy Neurologic: Grossly intact, no focal deficits Psychiatric: Normal mood and affect  Laboratory Data:  Recent Labs    05/23/19 1207  WBC 5.9  HGB 15.0  HCT 46.6*  PLT 214    Recent Labs    05/23/19 1207  NA 141  K 4.0  CL 105  GLUCOSE 101*  BUN 14  CALCIUM 9.7  CREATININE 0.71    CLINICAL DATA:  Diaphoretic. Abdominal pain 4 days. Concern for aortic dissection.  EXAM: CT ANGIOGRAPHY CHEST, ABDOMEN AND PELVIS  TECHNIQUE: Multidetector CT imaging through the chest, abdomen and pelvis was performed using the standard protocol during bolus administration of intravenous contrast. Multiplanar reconstructed images and MIPs were obtained and reviewed to evaluate the vascular anatomy.  CONTRAST:  167m OMNIPAQUE IOHEXOL 350 MG/ML SOLN  COMPARISON:  None.  FINDINGS: CTA CHEST FINDINGS  Cardiovascular: Noncontrast CT images of the chest demonstrate no intramural hematoma within the thoracic aorta. contrast enhanced series demonstrates no aortic dissection, ulceration or aneurysm. The ascending aorta is normal diameter at 33 mm. Typical 3 great vessel anatomy. Scattered intimal calcifications in the thoracic aorta.  Pacemaker in place with cardiac leads in the RIGHT heart. Scattered coronary calcifications.  Mediastinum/Nodes: No  axillary or supraclavicular adenopathy. No mediastinal hilar adenopathy. Esophagus normal. Trachea appears normal.  Lungs/Pleura: Lungs are clear.  Airways normal.  Musculoskeletal: No acute osseous abnormality.  Review of the MIP images confirms the above findings.  CTA ABDOMEN AND PELVIS FINDINGS  VASCULAR  Aorta: No dissection or aneurysm of the abdominal aorta.  Celiac: Widely patent  SMA: Widely patent. Replaced RIGHT hepatic artery originates from the SMA.  Renals: Single bilateral patent renal arteries - patent  IMA: Patent  Inflow: Normal  Veins: Normal  Review of the MIP images confirms the above findings.  NON-VASCULAR  Hepatobiliary: No focal hepatic lesion. No biliary duct dilatation. Gallbladder is normal. Common bile duct is normal.  Pancreas: Pancreas is normal. No ductal dilatation. No pancreatic inflammation.  Spleen: Normal spleen  Adrenals/urinary tract: Adrenal glands are normal. Kidneys enhance symmetrically.  Within the cortex of the  LEFT mid kidney is partially exophytic enhancing solid lesion measuring 2.4 x 2.0 by 2.2 cm (image 129/6)  Stomach/Bowel: Stomach, small bowel, appendix, and cecum are normal. The colon and rectosigmoid colon are normal.  Vascular/Lymphatic: No abdominal lymphadenopathy.  Reproductive: Post hysterectomy  Other: No free fluid.  Musculoskeletal: Posterior lumbar fusion. Degenerative sclerosis in the upper lumbar spine. 5 cm sclerotic lesion of the RIGHT iliac bone is presumably postsurgical.  Review of the MIP images confirms the above findings.  IMPRESSION: Chest Impression:  1. No evidence of aortic dissection or aneurysm. 2. Coronary artery calcification and Aortic Atherosclerosis (ICD10-I70.0).  Abdomen / Pelvis Impression:  1. No abdominal aortic aneurysm or dissection. 2. 5 cm sclerotic lesion in the RIGHT iliac bone presumably related to prior bone  harvesting. 3. Enhancing lesion in the mid LEFT kidney is consistent with a solid renal neoplasm (benign or malignant). Recommend non emergent urology consultation.  Findings conveyed toBRANDON MORELLI on 02/03/2019  at14:21.   Electronically Signed   By: Suzy Bouchard M.D.   On: 02/03/2019 14:27 No results found for this or any previous visit (from the past 24 hour(s)). Recent Results (from the past 240 hour(s))  Novel Coronavirus, NAA (Hosp order, Send-out to Ref Lab; TAT 18-24 hrs     Status: None   Collection Time: 05/22/19 11:15 AM   Specimen: Nasopharyngeal Swab; Respiratory  Result Value Ref Range Status   SARS-CoV-2, NAA NOT DETECTED NOT DETECTED Final    Comment: (NOTE) This nucleic acid amplification test was developed and its performance characteristics determined by [employer]. Nucleic acid amplification tests include PCR and TMA. This test has not been FDA cleared or approved. This test has been authorized by FDA under an Emergency Use Authorization (EUA). This test is only authorized for the duration of time the declaration that circumstances exist justifying the authorization of the emergency use of in vitro diagnostic tests for detection of SARS-CoV-2 virus and/or diagnosis of COVID-19 infection under section 564(b)(1) of the Act, 21 U.S.C. 626RSW-5(I) (1), unless the authorization is terminated or revoked sooner. When diagnostic testing is negative, the possibility of a false negative result should be considered in the context of a patient's recent exposures and the presence of clinical signs and symptoms consistent with COVID-19. An individual without symptoms of COVID- 19 and who is not shedding SARS-CoV-2 vi rus would expect to have a negative (not detected) result in this assay. Performed At: [facility] [address] [place], [place] [id number] Rush Farmer MD FG:[id number]    Bandana  Final     Comment: Performed at [facility], [place] [address] [place], [place] [address]    Renal Function: Recent Labs    05/23/19 1207  CREATININE 0.71   Estimated Creatinine Clearance: 70 mL/min (by C-G formula based on SCr of 0.71 mg/dL).  Radiologic Imaging: No results found.  I independently reviewed the above imaging studies.  Assessment and Plan SHELLA LAHMAN is a 73 y.o. female with a 2.4 cm solid and enhancing left renal mass with features concerning for RCC  -I personally reviewed imaging results and films with the patient. We discussed that the mass in question has features concerning for malignancy. I explained the natural history of presumed renal cell carcinoma. I reviewed the AUA guidelines for evaluation and treatment of the small renal mass. The options of active surveillance, in situ tumor ablation, partial and radical nephrectomy was discussed. The risks of robotic LEFT partial nephrectomy were discussed  in detail including but not limited to: negative pathology, open conversion, completion nephrectomy, infection of the urinary tract/skin/abdominal cavity, VTE, MI/CVA, lymphatic leak, injury to adjacent solid/hollow viscus organs, bleeding requiring a blood transfusion, catastrophic bleeding, hernia formation, need for postoperative angioembolization, urinary leak requiring stent/drain, and other imponderables.  Ellison Hughs, MD 05/25/2019, 6:24 AM  [facility] Pager: [phone]

## 2019-05-25 NOTE — Plan of Care (Signed)
All belongings are within reach call bell in place. Pt will call when needing to get out of the bed. Bed alarm set

## 2019-05-25 NOTE — Progress Notes (Signed)
Patient complained of worsening nausea with one episode of vomiting on admission. Provider contacted V.O received to administer phenergan IV 12.5 mg every four hours as needed for worsening nausea vomiting.

## 2019-05-25 NOTE — Progress Notes (Signed)
Patient arrived to unit. Pt is resting easy to arouse. Pt complained of nausea and had one episode of vomiting. Surgical incision are clean dry and intact. Will reschedule PO med's for 8 pm.

## 2019-05-25 NOTE — Discharge Instructions (Signed)

## 2019-05-25 NOTE — Op Note (Signed)
Operative Note  Preoperative diagnosis:  1.  2.4 cm left renal mass  Postoperative diagnosis: 1.  2.4 cm left renal mass  Procedure(s): 1.  Robot-assisted laparoscopic left partial nephrectomy  Surgeon: Ellison Hughs, MD   Assistants:  Debbrah Alar, PA-C An assistant was required for this surgical procedure.  The duties of the assistant included but were not limited to suctioning, passing suture, camera manipulation, retraction.  This procedure would not be able to be performed without an Environmental consultant.   Anesthesia:  General  Complications:  None  EBL:  50 mL  Specimens: 1. Left renal mass  Drains/Catheters: 1.  Foley catheter  Intraoperative findings:   1. Exophytic left upper pole posterior renal mass 2. The resection bed was hemostatic following repair  Indication:  [name] is a 73 y.o. female with incidentally identified 2.4 cm left renal mass with features concerning for renal cell carcinoma.  The patient has been consented for the above procedures, voices understanding and wishes to proceed.  Description of procedure:  After informed consent was obtained, the patient was brought to the operating room and general endotracheal anesthesia was administered.  The patient was then placed in the right lateral decubitus position and prepped and draped in usual sterile fashion.  A timeout was performed.  An 8 mm incision was then made lateral to the left rectus muscle at the level of the left 12th rib.  Abdominal access was obtained via a Veress needle.  The abdominal cavity was then insufflated up to 15 mmHg.  An 8 mm port was then introduced into the abdominal cavity.  Inspection of the port entry site by the robotic camera revealed no adjacent organ injury.  We then placed 3 additional 8 mm robotic ports to triangulate the left renal hilum.  A 12 mm assistant port was then placed between the carmera port and 3rd robotic arm.  The white line of Toldt along the  descending colon was incised sharply and the colon, along with its mesocolonic fat, was reflected medially until the aorta was identified.  We then made a small window adjacent to the lower pole of the left kidney, identifying the left psoas muscle, left ureter and left gonadal vein.  The left ureter and gonadal vein were then reflected anteriorly allowing [place] to then incised the perihilar attachments using electrocautery.  We encountered a small lumbar vein adjacent to the insertion of the left gonadal vein into the left renal vein.  This lumbar vein was ligated with hemo-lock clips in 2 places and incised sharply.  This provided [place] excellent exposure to the left renal hilum.  The perilymphatic tissue surrounding the left renal artery was carefully dissected away, creating a window to place a bulldog clamp later in the procedure.  The anterior portion of Gerota's fascia was incised, allowing reflection the perinephric fat medially and laterally until there was adequate exposure of the left renal mass.  Intraoperative ultrasound confirmed the heterogenous echogenicity of the lesion compared to the remainder of the renal parenchyma and allowed identification of the depth/borders of the mass, which were demarcated using electrocautery along the renal capsule.  We then exposed the left renal artery and placed a bulldog clamp, marking warm ischemia time.  The left kidney immediately became ischemic and pale in appearance.  The left renal mass was then sharply excised with minimal blood loss.  After the mass was free, it was placed in the left upper quadrant to be retrieved later on during the operation.  There was a 1 cm portion of the collecting system that was violated during excision of the mass that was closed using an interrupted 3-0 vicryl suture in a figure of 8 configuration. The renorrhaphy was then performed using a 3-0 V-lock in the deep layer of the renal parenchyma and then a series of horizontal  mattress sutures were used to reapproximate the renal capsule using interupted 1-0 Vicryl suture along with hemo-lock clips act as a buttress against the renal capsule.  Once adequate tension was placed on the renorrhaphy sutures and the repair appeared hemostatic, we then removed the bulldog clamp marking the end of warm ischemia time at 20 minutes.  There did not appear to be any obvious bleeding around the renal hilum nor surrounding our repair.  The incised Gerota's fascia overlying the mass was then reapproximated using a running 3-0 V lock suture.  The mass was then placed in an Endo Catch bag.  The robot was then de-docked and the camera was then reinserted into the assistant port. Laparoscopic graspers were then used to grab the string of the Endo Catch bag, which was brought out through the 12 mm assistant port.  The abdomen was then desufflated and all ports were removed.  The assistant port incision was then extended approximately 1 cm and the left renal mass, within the Endo Catch bag, was removed and sent to pathology for permanent section.  The fascia within the assistant port incision was then reapproximated using a 0 Vicryl suture.  The remainder of the incisions were then closed using 4-0 Monocryl and dressed appropriately.  Patient tolerated the procedure well and was transferred to the postanesthesia unit in stable condition.   Plan:  Monitor on the floor overnight.

## 2019-05-25 NOTE — Anesthesia Postprocedure Evaluation (Signed)
Anesthesia Post Note  Patient: [name]  Procedure(s) Performed: XI ROBOTIC ASSITED PARTIAL NEPHRECTOMY (Left )     Patient location during evaluation: PACU Anesthesia Type: General Level of consciousness: awake and alert, oriented and patient cooperative Pain management: pain level controlled Vital Signs Assessment: post-procedure vital signs reviewed and stable Respiratory status: spontaneous breathing, nonlabored ventilation and respiratory function stable Cardiovascular status: blood pressure returned to baseline and stable Postop Assessment: no apparent nausea or vomiting Anesthetic complications: no    Last Vitals:  Vitals:   05/25/19 1103 05/25/19 1600  BP: (!) 126/57 (!) 145/74  Pulse: 78 84  Resp: 18 18  Temp: 36.4 C   SpO2: 97% 100%    Last Pain:  Vitals:   05/25/19 1600  TempSrc:   PainSc: Pace

## 2019-05-25 NOTE — Anesthesia Procedure Notes (Signed)
Procedure Name: Intubation Date/Time: 05/25/2019 12:44 PM Performed by: [place] Labo C, CRNA Pre-anesthesia Checklist: Patient identified, Emergency Drugs available, Suction available and Patient being monitored Patient Re-evaluated:Patient Re-evaluated prior to induction Oxygen Delivery Method: Circle system utilized Preoxygenation: Pre-oxygenation with 100% oxygen Induction Type: IV induction Ventilation: Mask ventilation without difficulty Laryngoscope Size: Mac and 3 Grade View: Grade I Tube type: Oral Tube size: 7.5 mm Number of attempts: 1 Airway Equipment and Method: Stylet and Oral airway Placement Confirmation: ETT inserted through vocal cords under direct vision,  positive ETCO2 and breath sounds checked- equal and bilateral Secured at: 20 cm Tube secured with: Tape Dental Injury: Teeth and Oropharynx as per pre-operative assessment

## 2019-05-26 ENCOUNTER — Encounter (HOSPITAL_COMMUNITY): Payer: Self-pay | Admitting: Urology

## 2019-05-26 DIAGNOSIS — C642 Malignant neoplasm of left kidney, except renal pelvis: Secondary | ICD-10-CM | POA: Diagnosis not present

## 2019-05-26 LAB — BASIC METABOLIC PANEL
Anion gap: 11 (ref 5–15)
BUN: 13 mg/dL (ref 8–23)
CO2: 23 mmol/L (ref 22–32)
Calcium: 8.7 mg/dL — ABNORMAL LOW (ref 8.9–10.3)
Chloride: 103 mmol/L (ref 98–111)
Creatinine, Ser: 0.96 mg/dL (ref 0.44–1.00)
GFR calc Af Amer: >60 mL/min (ref >60)
GFR calc non Af Amer: 60 mL/min — ABNORMAL LOW (ref >60)
Glucose, Bld: 151 mg/dL — ABNORMAL HIGH (ref 70–99)
Potassium: 3.8 mmol/L (ref 3.5–5.1)
Sodium: 137 mmol/L (ref 135–145)

## 2019-05-26 LAB — HEMOGLOBIN AND HEMATOCRIT, BLOOD
HCT: 40.9 % (ref 36.0–46.0)
Hemoglobin: 13.1 g/dL (ref 12.0–15.0)

## 2019-05-26 MED ORDER — CHLORHEXIDINE GLUCONATE CLOTH 2 % EX PADS: 6.0000 | MEDICATED_PAD | Freq: Every day | CUTANEOUS | Status: DC

## 2019-05-26 NOTE — Discharge Summary (Addendum)
Physician Discharge Summary  Patient ID: [name] MRN: [id number] DOB/AGE: Jun 02, 1946 73 y.o.  Admit date: 05/25/2019 Discharge date: 05/26/2019  Admission Diagnoses: Left renal mass  Discharge Diagnoses:  Active Problems:   Renal mass   Discharged Condition: good  Hospital Course: Patient was admitted following left partial nephrectomy done laparoscopically.  She has done well.  She has been afebrile with stable vitals postop day 1, ambulating, tolerating p.o. with good pain control.  She is voiding on her own. Urine clear yellow. No flank pain.   She has passed gas.  Labs stable this morning.  Ready for discharge.  Consults: None  Significant Diagnostic Studies: none  Treatments: surgery: Laparoscopic left partial nephrectomy  Discharge Exam: Blood pressure (!) 110/55, pulse 84, temperature 97.7 F (36.5 C), temperature source Oral, resp. rate 18, height 5\' 5"  (1.651 m), weight 86.3 kg, SpO2 99 %. No acute distress, watching TV and talking with her husband Cardiovascular-regular rate and rhythm Respiratory-regular effort and depth Abdomen-soft and nontender, incisions are clean dry and intact Extremity-no calf pain or swelling  Disposition: Discharge disposition: 01-Home or Self Care        Allergies as of 05/26/2019      Reactions   Sulfa Antibiotics Rash      Medication List    STOP taking these medications   multivitamin with minerals tablet   Vitamin D-3 125 MCG (5000 UT) Tabs     TAKE these medications   acetaminophen 500 MG tablet Commonly known as: TYLENOL Take 500 mg by mouth daily with breakfast.   cetirizine 10 MG tablet Commonly known as: ZYRTEC Take 10 mg by mouth daily. *Allertec   docusate sodium 100 MG capsule Commonly known as: COLACE Take 1 capsule (100 mg total) by mouth 2 (two) times daily.   ezetimibe 10 MG tablet Commonly known as: ZETIA Take 10 mg by mouth daily.   famotidine 40 MG tablet Commonly known as:  PEPCID Take 40 mg by mouth daily.   gabapentin 300 MG capsule Commonly known as: NEURONTIN Take 300 mg by mouth every evening.   HYDROcodone-acetaminophen 5-325 MG tablet Commonly known as: Norco Take 1-2 tablets by mouth every 6 (six) hours as needed for moderate pain.   promethazine 12.5 MG tablet Commonly known as: PHENERGAN Take 1 tablet (12.5 mg total) by mouth every 4 (four) hours as needed for nausea or vomiting.   Salonpas 3.07-10-08 % Ptch Generic drug: Camphor-Menthol-Methyl Sal Apply 1 patch topically daily as needed (pain).   SYSTANE OP Place 1 drop into both eyes 3 (three) times daily as needed (for dry eyes).   tretinoin 0.025 % cream Commonly known as: RETIN-A Apply 1 application topically 2 (two) times a week.      Follow-up Information    Ceasar Mons, MD On 06/08/2019.   Specialty: Urology Why: at 8:45 Contact information: [address] [place] [address] [phone]           Signed: Festus Aloe 05/26/2019, 3:52 PM

## 2019-05-26 NOTE — Care Management Obs Status (Signed)
[facility] NOTIFICATION   Patient Details  Name: AELLA SHACKELFORD MRN: [id number] Date of Birth: Sep 23, 1945   Medicare Observation Status Notification Given:  Yes    Joaquin Courts, RN 05/26/2019, 4:13 PM

## 2019-05-26 NOTE — Progress Notes (Signed)
Patient discharged home as ordered. FC was removed and pt able to void with no difficulties. Pt's pain well managed with only PO Tylenol. Pt able to tolerated regular diet. [employer] was notified prior to D/C and pt's pacemaker was readjusted to pre-surgical settings. Pt assisted to husband's vehicle by W/C with assist from nursing staff.

## 2019-05-28 LAB — SURGICAL PATHOLOGY

## 2019-09-20 IMAGING — CT CT ANGIO CHEST-ABD-PELV FOR DISSECTION W/ AND WO/W CM
2 of 7 series · 14 of 46 positions shown, 16 images · IV contrast (omnipaque)
Comparison: None.

CLINICAL DATA: Diaphoretic. Abdominal pain 4 days. Concern for
aortic dissection.

EXAM:
CT ANGIOGRAPHY CHEST, ABDOMEN AND PELVIS
TECHNIQUE: Multidetector CT imaging through the chest, abdomen and pelvis was
performed using the standard protocol during bolus administration of
intravenous contrast. Multiplanar reconstructed images and MIPs were
obtained and reviewed to evaluate the vascular anatomy.
CONTRAST:  100mL OMNIPAQUE IOHEXOL 350 MG/ML SOLN

[Series 6: arterial · axial · arterial · 0.77mm/px · z∈[+1009,+1553]mm · 11 of 306 slices shown, 13 images]
[im 17/306  soft-tissue]
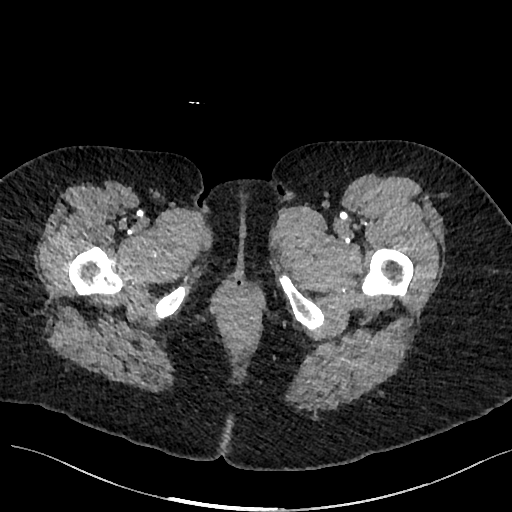
[im 17/306  bone]
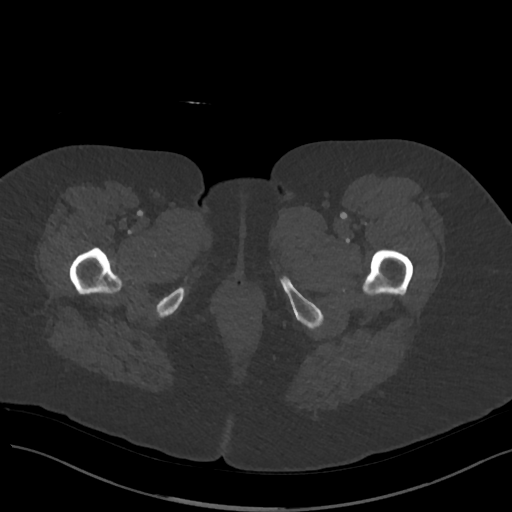
[im 49/306  soft-tissue]
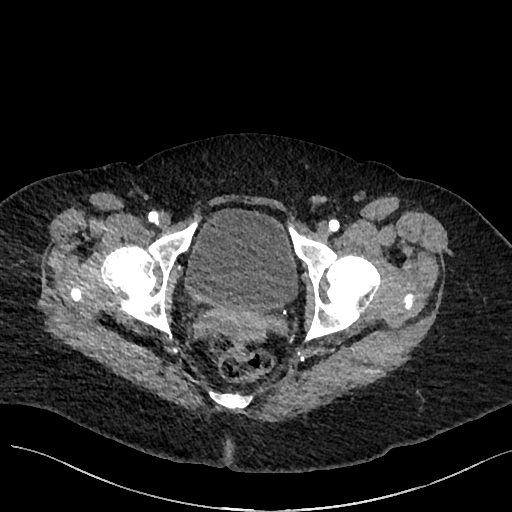
[im 81/306  soft-tissue]
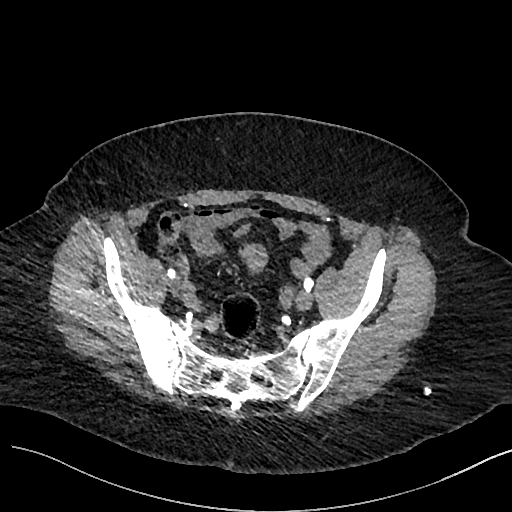
[im 97/306  soft-tissue]
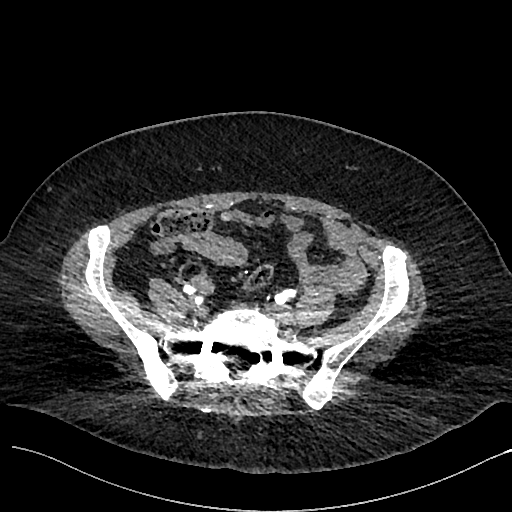
[im 129/306  soft-tissue]
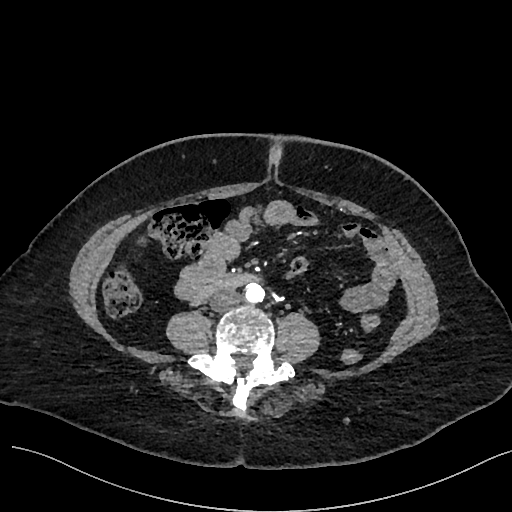
[im 161/306  soft-tissue]
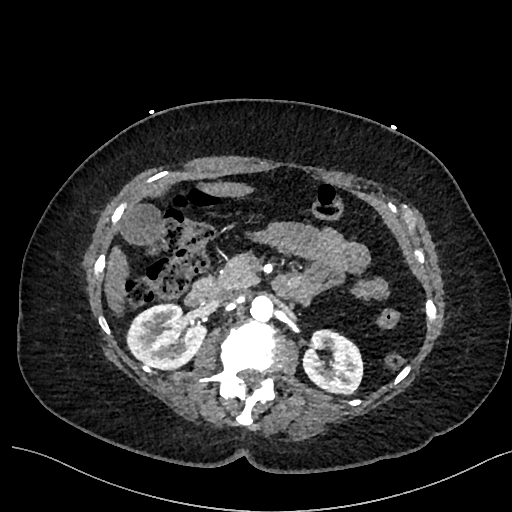
[im 177/306  soft-tissue]
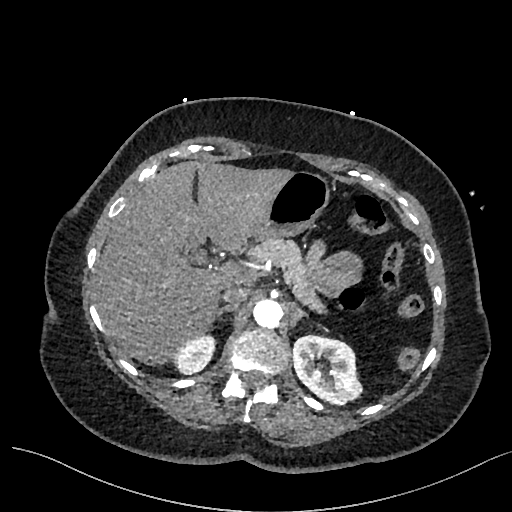
[im 209/306  soft-tissue]
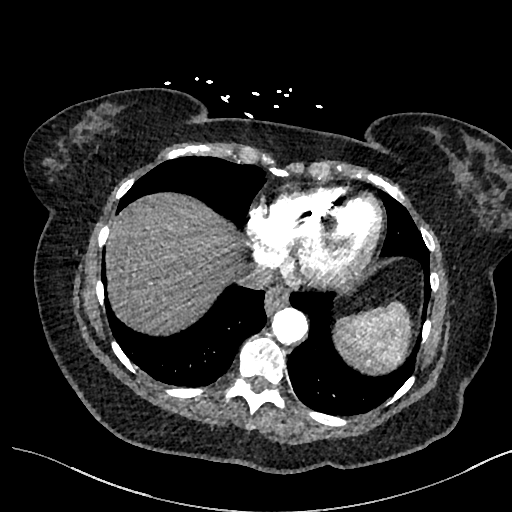
[im 225/306  soft-tissue]
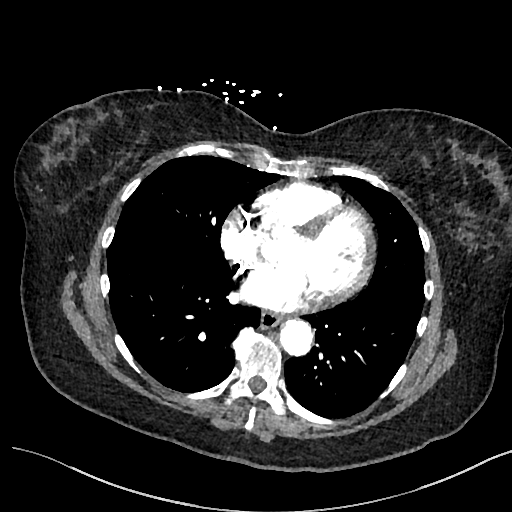
[im 225/306  bone]
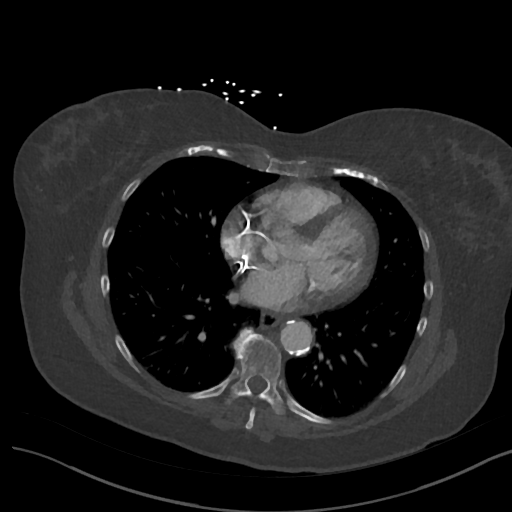
[im 257/306  soft-tissue]
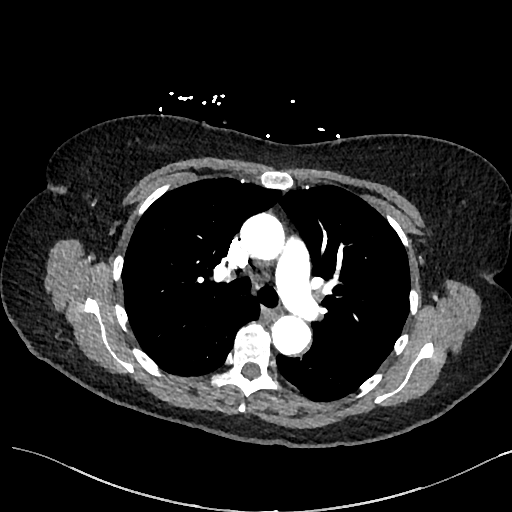
[im 289/306  soft-tissue]
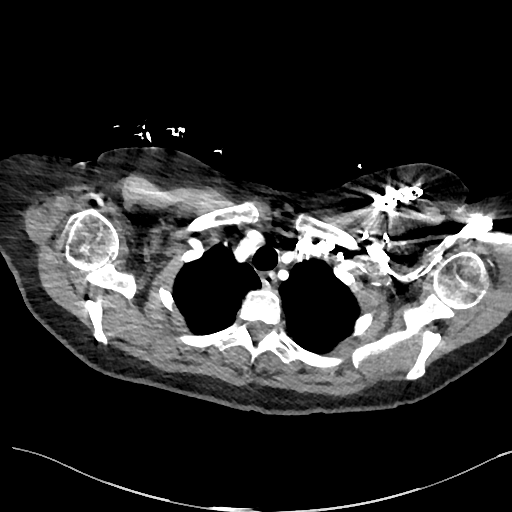

[Series 9: cor · coronal · 0.85mm/px · 3 of 168 slices shown]
[im 42/168  soft-tissue]
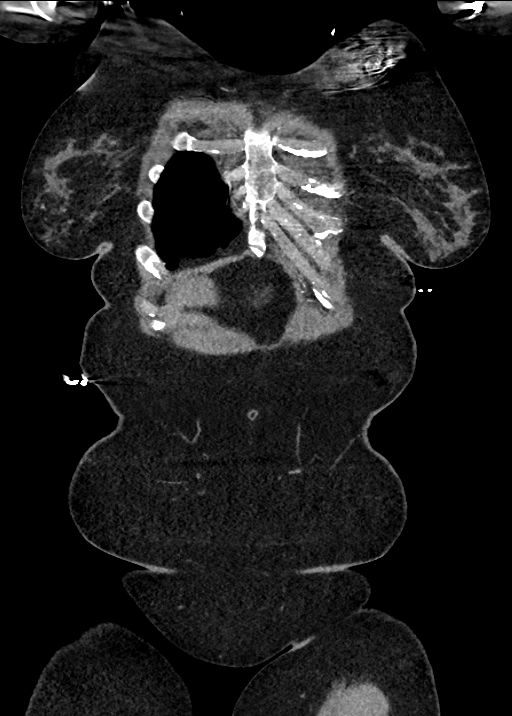
[im 84/168  soft-tissue]
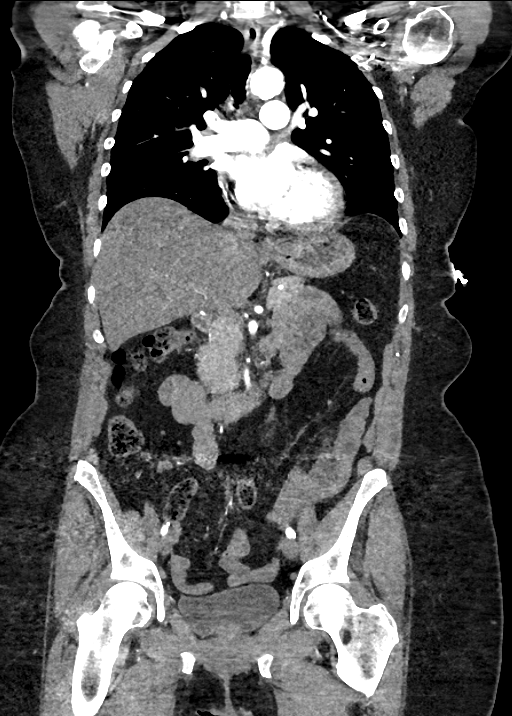
[im 126/168  soft-tissue]
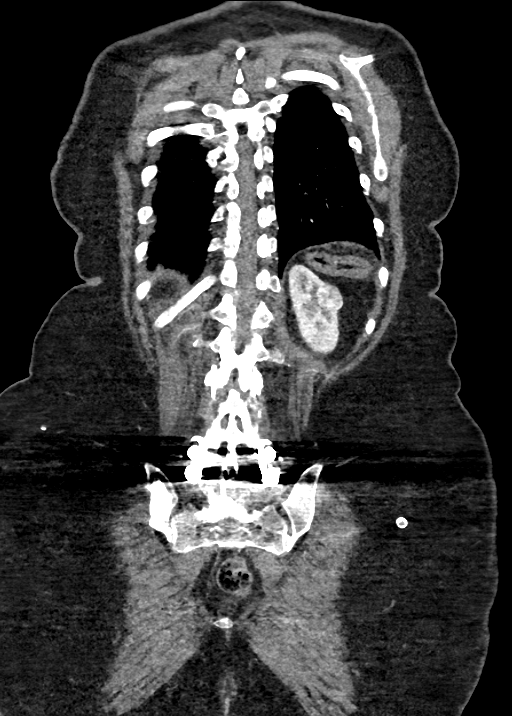

[14 of 46 positions shown; findings below may reference images not displayed]

FINDINGS: CTA CHEST FINDINGS

Cardiovascular: Noncontrast CT images of the chest demonstrate no
intramural hematoma within the thoracic aorta. contrast enhanced
series demonstrates no aortic dissection, ulceration or aneurysm.
The ascending aorta is normal diameter at 33 mm. Typical 3 great
vessel anatomy. Scattered intimal calcifications in the thoracic
aorta.

Pacemaker in place with cardiac leads in the RIGHT heart. Scattered
coronary calcifications.

Mediastinum/Nodes: No axillary or supraclavicular adenopathy. No
mediastinal hilar adenopathy. Esophagus normal. Trachea appears
normal.

Lungs/Pleura: Lungs are clear.  Airways normal.

Musculoskeletal: No acute osseous abnormality.

Review of the MIP images confirms the above findings.

CTA ABDOMEN AND PELVIS FINDINGS

VASCULAR

Aorta: No dissection or aneurysm of the abdominal aorta.

Celiac: Widely patent

SMA: Widely patent. Replaced RIGHT hepatic artery originates from
the SMA.

Renals: Single bilateral patent renal arteries - patent

IMA: Patent

Inflow: Normal

Veins: Normal

Review of the MIP images confirms the above findings.

NON-VASCULAR

Hepatobiliary: No focal hepatic lesion. No biliary duct dilatation.
Gallbladder is normal. Common bile duct is normal.

Pancreas: Pancreas is normal. No ductal dilatation. No pancreatic
inflammation.

Spleen: Normal spleen

Adrenals/urinary tract: Adrenal glands are normal. Kidneys enhance
symmetrically.

Within the cortex of the LEFT mid kidney is partially exophytic
enhancing solid lesion measuring 2.4 x 2.0 by 2.2 cm (image 129/6)

Stomach/Bowel: Stomach, small bowel, appendix, and cecum are normal.
The colon and rectosigmoid colon are normal.

Vascular/Lymphatic: No abdominal lymphadenopathy.

Reproductive: Post hysterectomy

Other: No free fluid.

Musculoskeletal: Posterior lumbar fusion. Degenerative sclerosis in
the upper lumbar spine. 5 cm sclerotic lesion of the RIGHT iliac
bone is presumably postsurgical.

Review of the MIP images confirms the above findings.
IMPRESSION: Chest Impression:

1. No evidence of aortic dissection or aneurysm.
2. Coronary artery calcification and Aortic Atherosclerosis
([id number]).

Abdomen / Pelvis Impression:

1. No abdominal aortic aneurysm or dissection.
2. 5 cm sclerotic lesion in the RIGHT iliac bone presumably related
to prior bone harvesting.
3. Enhancing lesion in the mid LEFT kidney is consistent with a
solid renal neoplasm (benign or malignant). Recommend non emergent
urology consultation.

Findings conveyed [place] on 02/03/2019  at[DATE].

## 2019-10-17 ENCOUNTER — Ambulatory Visit (HOSPITAL_COMMUNITY)
Admission: RE | Admit: 2019-10-17 | Discharge: 2019-10-17 | Disposition: A | Discharge status: Home / Self Care | Payer: Medicare PPO | Source: Ambulatory Visit | Attending: Urology | Admitting: Urology

## 2019-10-17 ENCOUNTER — Other Ambulatory Visit: Payer: Self-pay

## 2019-10-17 ENCOUNTER — Other Ambulatory Visit (HOSPITAL_COMMUNITY): Payer: Self-pay | Admitting: Urology

## 2019-10-17 DIAGNOSIS — C642 Malignant neoplasm of left kidney, except renal pelvis: Secondary | ICD-10-CM | POA: Diagnosis present

## 2020-12-10 ENCOUNTER — Ambulatory Visit (HOSPITAL_COMMUNITY)
Admission: RE | Admit: 2020-12-10 | Discharge: 2020-12-10 | Disposition: A | Discharge status: Home / Self Care | Payer: Medicare PPO | Source: Ambulatory Visit | Attending: Urology | Admitting: Urology

## 2020-12-10 ENCOUNTER — Other Ambulatory Visit: Payer: Self-pay

## 2020-12-10 ENCOUNTER — Other Ambulatory Visit (HOSPITAL_COMMUNITY): Payer: Self-pay | Admitting: Urology

## 2020-12-10 DIAGNOSIS — C642 Malignant neoplasm of left kidney, except renal pelvis: Secondary | ICD-10-CM | POA: Insufficient documentation

## 2021-07-27 IMAGING — CR DG CHEST 2V
2 series · 2 of 2 positions shown · non-contrast
Comparison: 10/17/2019

CLINICAL DATA: Left renal cell carcinoma

EXAM:
CHEST - 2 VIEW

[w chest pa]
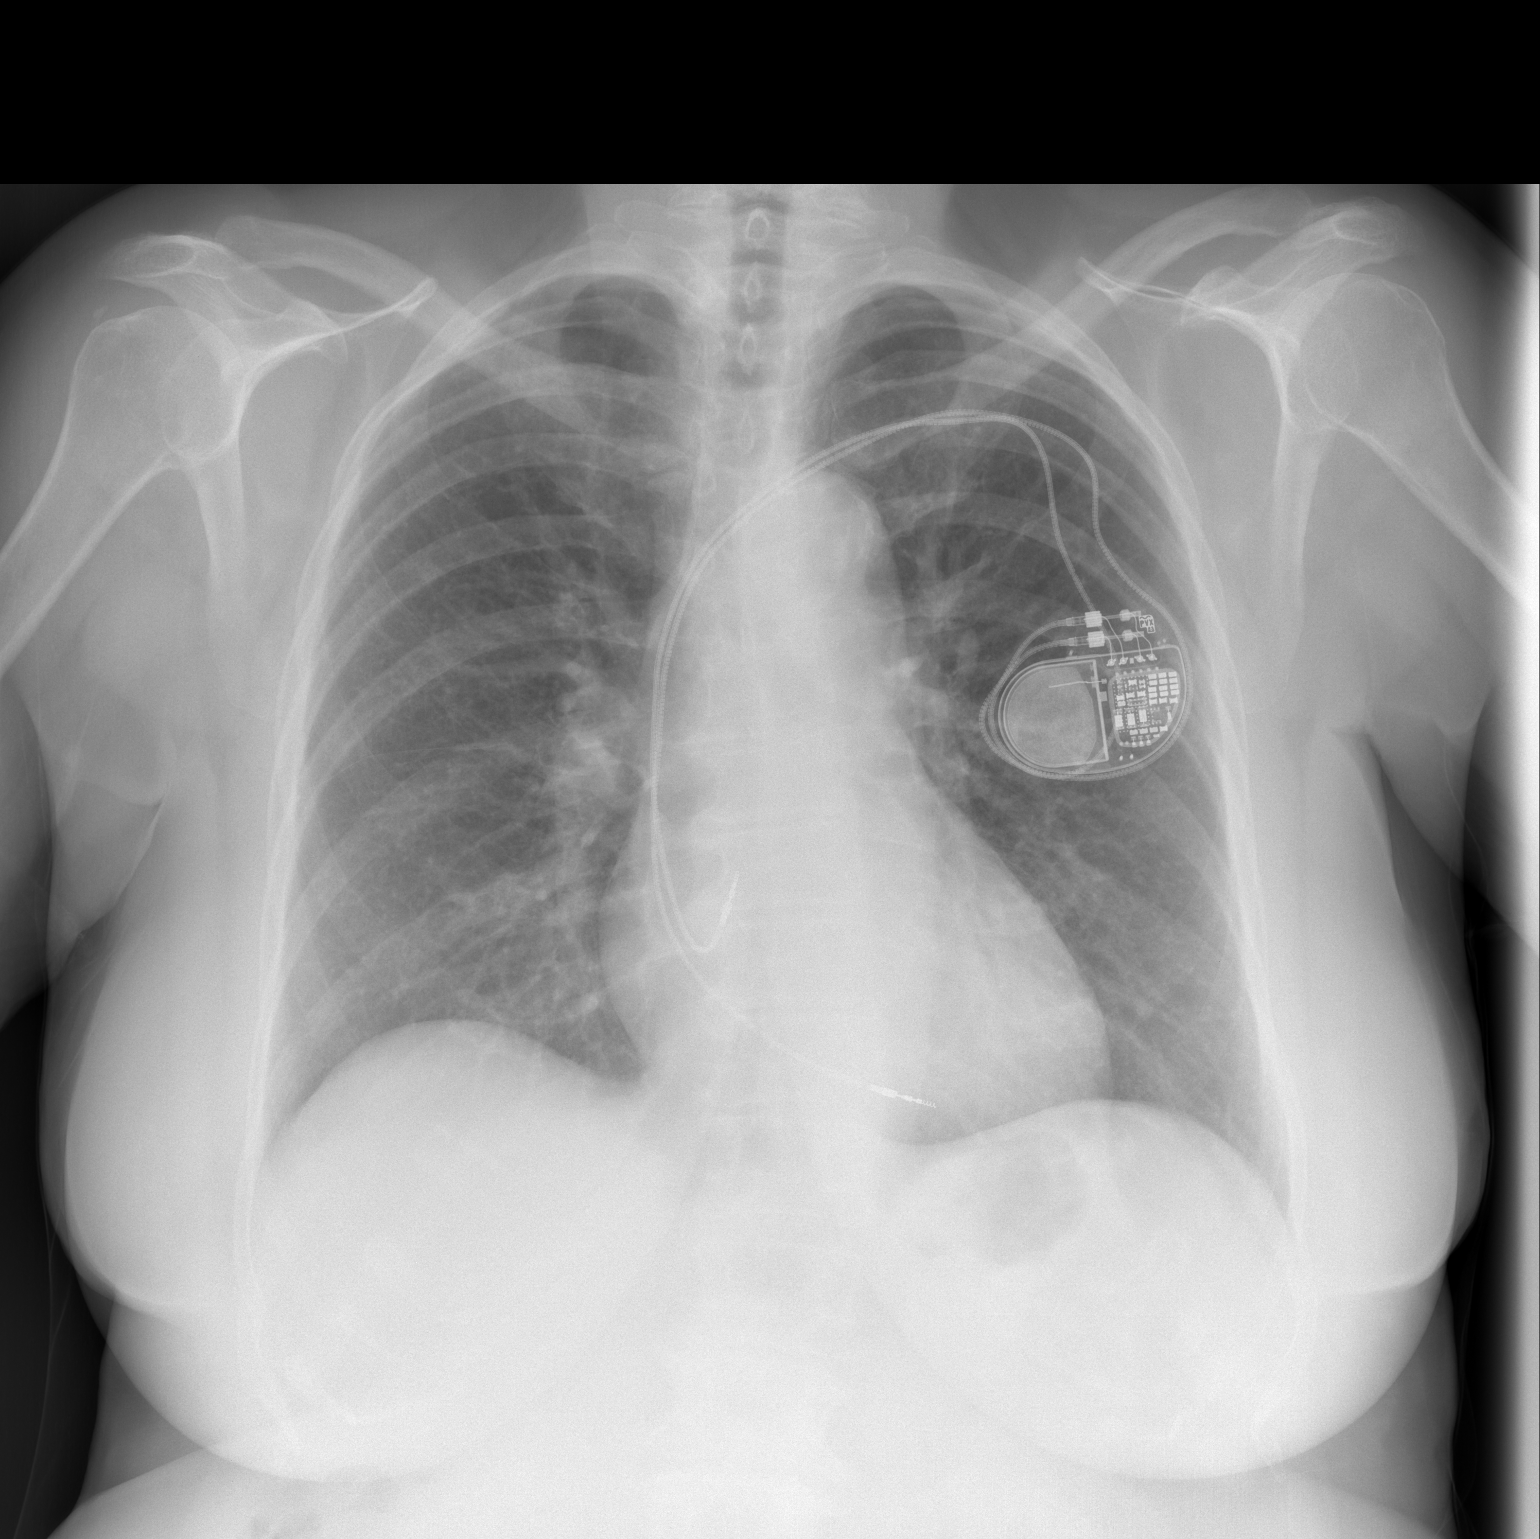

[w chest lat]
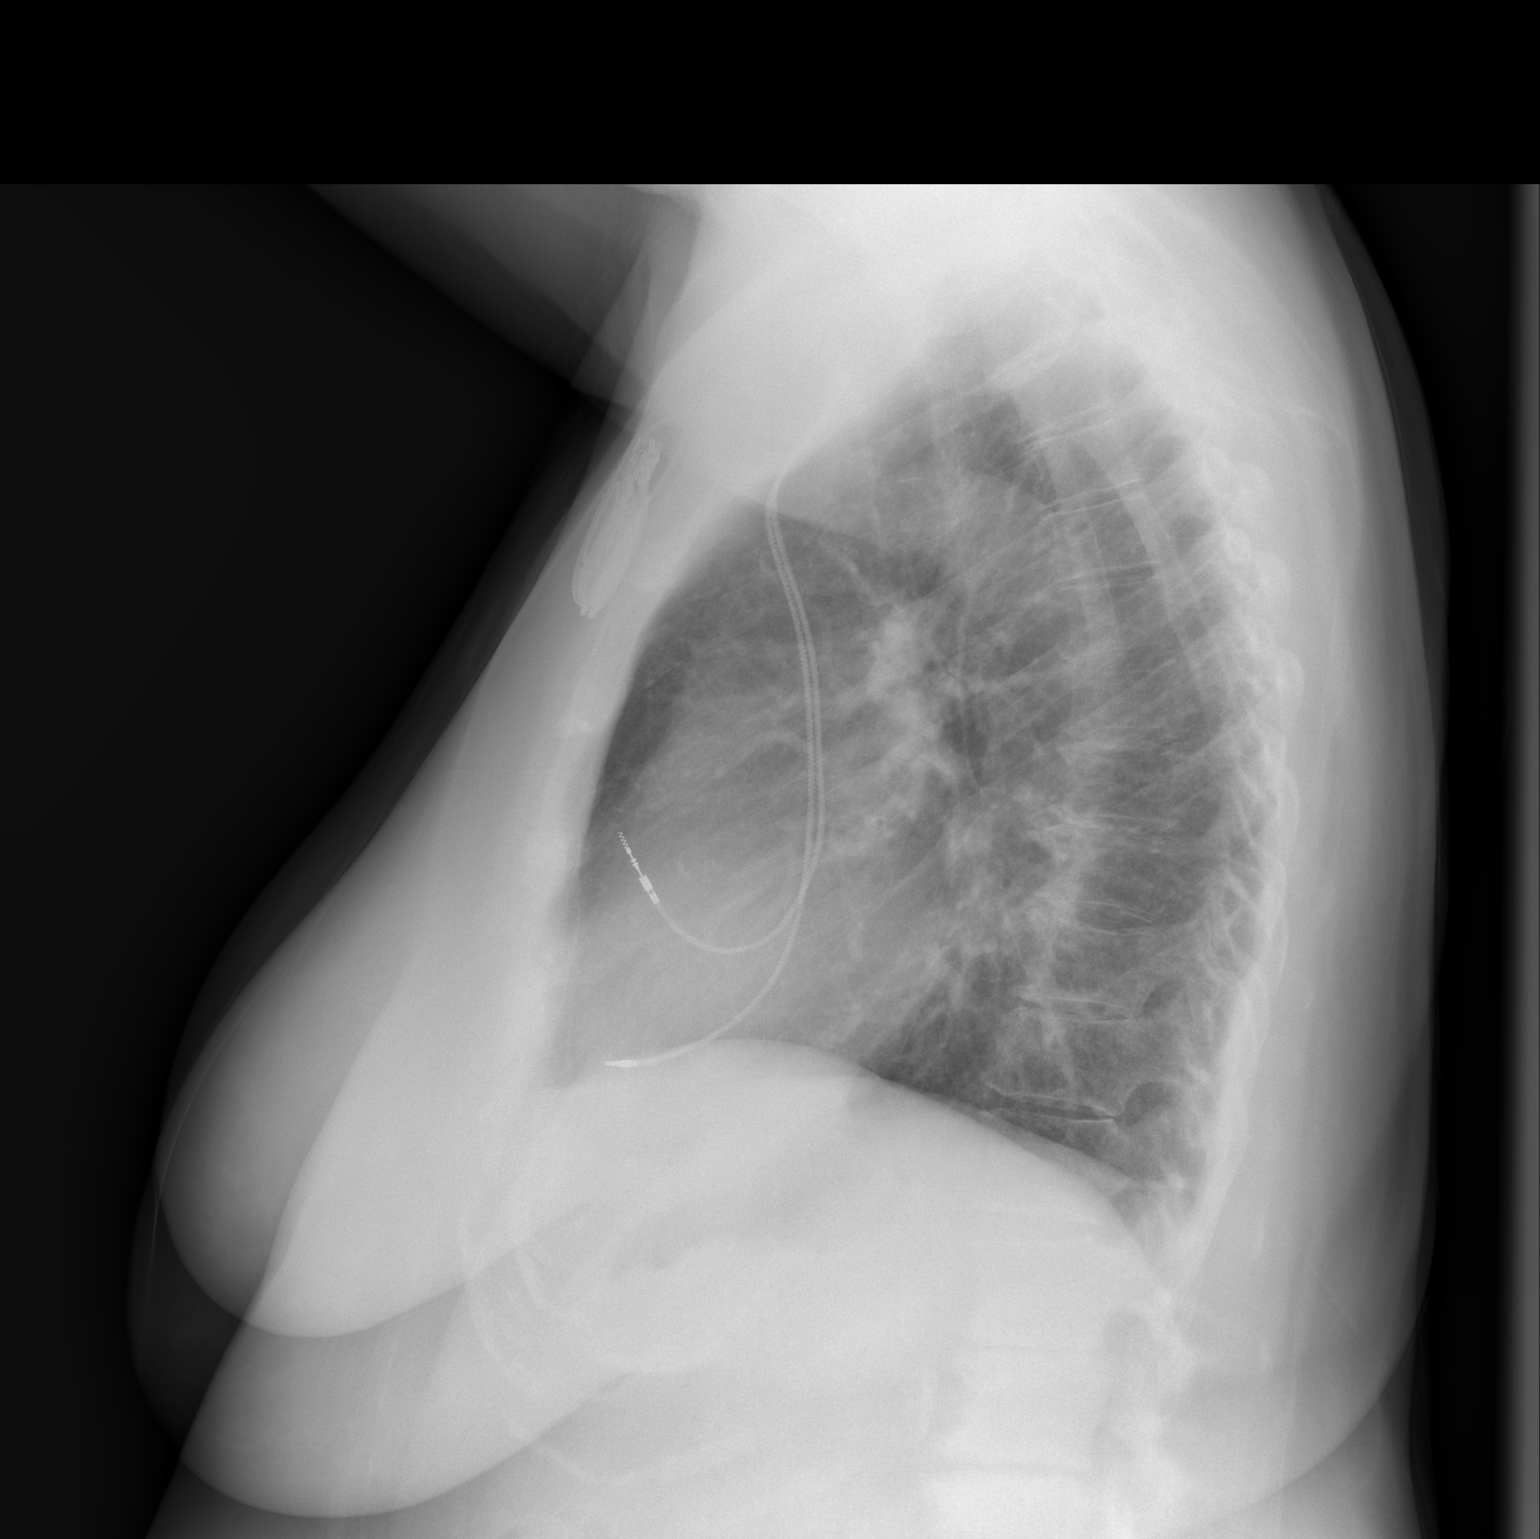

[2 of 2 positions shown; findings below may reference images not displayed]

FINDINGS: Heart size is normal. Dual lead pacemaker in place. Aortic
atherosclerotic calcification is noted. The lungs are clear. No
evidence of pulmonary metastatic disease. The vascularity is normal.
No effusions. Mild scoliosis of the spine without evidence of
fracture or lytic lesion.
IMPRESSION: No evidence of metastatic disease. Cardiac pacemaker. Aortic
atherosclerosis. Mild scoliosis.
